# Patient Record
Sex: Female | Born: 1980
Health system: Southern US, Community
[De-identification: ages and names within clinical notes are randomized; demographics above are authoritative.]

## PROBLEM LIST (undated history)

## (undated) DIAGNOSIS — O24419 Gestational diabetes mellitus in pregnancy, unspecified control: Secondary | ICD-10-CM

## (undated) DIAGNOSIS — N8501 Benign endometrial hyperplasia: Secondary | ICD-10-CM

## (undated) HISTORY — DX: Gestational diabetes mellitus in pregnancy, unspecified control: O24.419

## (undated) HISTORY — DX: Benign endometrial hyperplasia: N85.01

## (undated) HISTORY — PX: DILATION AND CURETTAGE OF UTERUS: SHX78

---

## 2002-02-02 ENCOUNTER — Other Ambulatory Visit: Admission: RE | Admit: 2002-02-02 | Discharge: 2002-02-02 | Payer: Self-pay | Admitting: Gynecology

## 2006-09-08 ENCOUNTER — Other Ambulatory Visit: Admission: RE | Admit: 2006-09-08 | Discharge: 2006-09-08 | Payer: Self-pay | Admitting: Gynecology

## 2007-12-31 ENCOUNTER — Emergency Department (HOSPITAL_COMMUNITY): Admission: EM | Admit: 2007-12-31 | Discharge: 2008-01-01 | Payer: Self-pay | Admitting: Emergency Medicine

## 2009-01-03 ENCOUNTER — Encounter: Payer: Self-pay | Admitting: Gynecology

## 2009-01-03 ENCOUNTER — Other Ambulatory Visit: Admission: RE | Admit: 2009-01-03 | Discharge: 2009-01-03 | Payer: Self-pay | Admitting: Gynecology

## 2009-01-03 ENCOUNTER — Ambulatory Visit: Payer: Self-pay | Admitting: Gynecology

## 2009-07-03 ENCOUNTER — Ambulatory Visit: Payer: Self-pay | Admitting: Gynecology

## 2009-07-03 ENCOUNTER — Other Ambulatory Visit: Admission: RE | Admit: 2009-07-03 | Discharge: 2009-07-03 | Payer: Self-pay | Admitting: Gynecology

## 2010-03-27 ENCOUNTER — Other Ambulatory Visit: Admission: RE | Admit: 2010-03-27 | Discharge: 2010-03-27 | Payer: Self-pay | Admitting: Gynecology

## 2010-03-27 ENCOUNTER — Ambulatory Visit: Payer: Self-pay | Admitting: Gynecology

## 2010-05-16 ENCOUNTER — Ambulatory Visit: Payer: Self-pay | Admitting: Gynecology

## 2010-05-28 ENCOUNTER — Ambulatory Visit: Payer: Self-pay | Admitting: Gynecology

## 2010-06-11 ENCOUNTER — Ambulatory Visit: Payer: Self-pay | Admitting: Women's Health

## 2010-06-13 ENCOUNTER — Ambulatory Visit: Payer: Self-pay | Admitting: Gynecology

## 2010-06-19 ENCOUNTER — Ambulatory Visit
Admission: RE | Admit: 2010-06-19 | Discharge: 2010-06-19 | Payer: Self-pay | Source: Home / Self Care | Attending: Gynecology | Admitting: Gynecology

## 2010-06-19 ENCOUNTER — Ambulatory Visit: Payer: Self-pay | Admitting: Gynecology

## 2010-07-11 ENCOUNTER — Ambulatory Visit
Admission: RE | Admit: 2010-07-11 | Discharge: 2010-07-11 | Payer: Self-pay | Source: Home / Self Care | Attending: Gynecology | Admitting: Gynecology

## 2012-08-24 ENCOUNTER — Encounter: Payer: Self-pay | Admitting: Gynecology

## 2012-08-31 ENCOUNTER — Other Ambulatory Visit (HOSPITAL_COMMUNITY)
Admission: RE | Admit: 2012-08-31 | Discharge: 2012-08-31 | Disposition: A | Discharge status: Home / Self Care | Payer: 59 | Source: Ambulatory Visit | Attending: Gynecology | Admitting: Gynecology

## 2012-08-31 ENCOUNTER — Encounter: Payer: Self-pay | Admitting: Gynecology

## 2012-08-31 ENCOUNTER — Ambulatory Visit (INDEPENDENT_AMBULATORY_CARE_PROVIDER_SITE_OTHER): Payer: 59 | Admitting: Gynecology

## 2012-08-31 VITALS — BP 124/78 | Ht 63.5 in | Wt 131.0 lb

## 2012-08-31 DIAGNOSIS — Z1151 Encounter for screening for human papillomavirus (HPV): Secondary | ICD-10-CM | POA: Insufficient documentation

## 2012-08-31 DIAGNOSIS — Z01419 Encounter for gynecological examination (general) (routine) without abnormal findings: Secondary | ICD-10-CM | POA: Insufficient documentation

## 2012-08-31 DIAGNOSIS — Z23 Encounter for immunization: Secondary | ICD-10-CM

## 2012-08-31 LAB — CBC WITH DIFFERENTIAL/PLATELET
Basophils Relative: 0 % (ref 0–1)
Eosinophils Absolute: 0.2 10*3/uL (ref 0.0–0.7)
Eosinophils Relative: 2 % (ref 0–5)
Hemoglobin: 15.6 g/dL — ABNORMAL HIGH (ref 12.0–15.0)
Lymphs Abs: 2.7 10*3/uL (ref 0.7–4.0)
MCH: 30.5 pg (ref 26.0–34.0)
MCHC: 34.9 g/dL (ref 30.0–36.0)
MCV: 87.3 fL (ref 78.0–100.0)
Monocytes Relative: 6 % (ref 3–12)
Neutrophils Relative %: 67 % (ref 43–77)
Platelets: 380 10*3/uL (ref 150–400)
RBC: 5.12 MIL/uL — ABNORMAL HIGH (ref 3.87–5.11)

## 2012-08-31 NOTE — Progress Notes (Signed)
Jennifer Duarte [birth year] [id number]   History:    32 y.o.  for annual gyn exam with no complaints today. Patient wanted to discuss contraceptive options. Patient is not sexually active currently. She is having normal cycles lasting 5-7 days. Several years ago she had good experience with a Mirena IUD. She had a first trimester miscarriage resulting in a D&E in 2012. Patient freely does her self breast examination. Patient complaining of some weight gain.patient with no prior history of abnormal Pap smears  Past medical history,surgical history, family history and social history were all reviewed and documented in the EPIC chart.  Gynecologic History Patient's last menstrual period was 08/23/2012. Contraception: none Last Pap: 2011. Results were: normal Last mammogram: not indicated. Results were: not indicated  Obstetric History OB History   Grav Para Term Preterm Abortions TAB SAB Ect Mult Living   3 2   1  1   2      # Outc Date GA Lbr Len/2nd Wgt Sex Del Anes PTL Lv   1 PAR            2 PAR            3 SAB                ROS: A ROS was performed and pertinent positives and negatives are included in the history.  GENERAL: No fevers or chills. HEENT: No change in vision, no earache, sore throat or sinus congestion. NECK: No pain or stiffness. CARDIOVASCULAR: No chest pain or pressure. No palpitations. PULMONARY: No shortness of breath, cough or wheeze. GASTROINTESTINAL: No abdominal pain, nausea, vomiting or diarrhea, melena or bright red blood per rectum. GENITOURINARY: No urinary frequency, urgency, hesitancy or dysuria. MUSCULOSKELETAL: No joint or muscle pain, no back pain, no recent trauma. DERMATOLOGIC: No rash, no itching, no lesions. ENDOCRINE: No polyuria, polydipsia, no heat or cold intolerance. No recent change in weight. HEMATOLOGICAL: No anemia or easy bruising or bleeding. NEUROLOGIC: No headache, seizures, numbness, tingling or weakness. PSYCHIATRIC: No depression, no loss  of interest in normal activity or change in sleep pattern.     Exam: chaperone present  BP 124/78  Ht 5' 3.5" (1.613 m)  Wt 131 lb (59.421 kg)  BMI 22.84 kg/m2  LMP 08/23/2012  Body mass index is 22.84 kg/(m^2).  General appearance : Well developed well nourished female. No acute distress HEENT: Neck supple, trachea midline, no carotid bruits, no thyroidmegaly Lungs: Clear to auscultation, no rhonchi or wheezes, or rib retractions  Heart: Regular rate and rhythm, no murmurs or gallops Breast:Examined in sitting and supine position were symmetrical in appearance, no palpable masses or tenderness,  no skin retraction, no nipple inversion, no nipple discharge, no skin discoloration, no axillary or supraclavicular lymphadenopathy Abdomen: no palpable masses or tenderness, no rebound or guarding Extremities: no edema or skin discoloration or tenderness  Pelvic:  Bartholin, Urethra, Skene Glands: Within normal limits             Vagina: No gross lesions or discharge  Cervix: No gross lesions or discharge  Uterus  anteverted, normal size, shape and consistency, non-tender and mobile  Adnexa  Without masses or tenderness  Anus and perineum  normal   Rectovaginal  normal sphincter tone without palpated masses or tenderness             Hemoccult not indicated     Assessment/Plan:  32 y.o. female for annual exam who had a Pap smear done today. We did discuss  the new Pap smear screening guidelines. Patient will receive the Tdap vaccine today. The following labs were ordered CBC, cholesterol, hemoglobin A1c, as well as TSH and urinalysis. Literature information on the Mirena IUD was provided. She will make arrangements with the start of her next cycle. She was reminded to do her monthly self breast examination. All the above information was discussed with the patient in Spanish.    [name] MD, 12:19 PM 08/31/2012

## 2012-08-31 NOTE — Addendum Note (Signed)
Addended by: Bertram Savin A on: 08/31/2012 12:50 PM   Modules accepted: Orders

## 2012-08-31 NOTE — Patient Instructions (Addendum)
Informacin sobre el dispositivo intrauterino  (Intrauterine Device Information) El dispositivo intrauterino (DIU) se inserta en el tero e impide el embarazo. Hay dos tipos de DIU:   DIU de cobre. Este tipo de DIU est recubierto con un alambre de cobre y se inserta dentro del tero. El cobre hace que el tero y las trompas de Falopio produzcan un liquido que [employer] espermatozoides. El DIU de cobre puede Geneticist, molecular durante 10 aos.  DIU hormonal. Este tipo de DIU contiene la hormona progestina (progesterona sinttica). La hormona espesa el moco cervical y evita que los espermatozoides ingresen al tero y tambin afina la membrana que cubre el tero para evitar la implantacin del vulo fertilizado. La hormona debilita o destruye los espermatozoides que ingresan al tero. El DIU hormonal puede Geneticist, molecular durante 5 aos. El mdico se asegurar de que usted es una buena candidata para usar el DIU cono anticonceptivo. Hable con su mdico acerca de los posibles efectos secundarios.  VENTAJAS  Es muy eficaz, reversible, de accin prolongada y de bajo mantenimiento.  No hay efectos secundarios relacionados con el estrgeno.  El DIU puede ser utilizado durante la Market researcher.  No est asociado con el aumento de [place].  Funciona inmediatamente despus de la insercin.  El DIU de cobre no interfiere con las hormonas femeninas.  El DIU con progesterona puede hacer que los perodos menstruales no sean tan abundantes.  El DIU de progesterona puede usarse durante 5 aos.  El DIU de cobre puede usarse durante 10 aos. DESVENTAJAS  El DIU de progesterona puede estar asociado con patrones de sangrado irregular.  El DIU de cobre puede hacer que el flujo menstrual ms abundante y doloroso.  Puede experimentar clicos y sangrado vaginal despus de la insercin. Document Released: 11/28/2009 Document Revised: 09/02/2011 [facility] Patient Information 2013 [place],  [place]. Vacuna difteria/ttanos (Td) o [place] difteria, ttanos, tos convulsa (Tdap), Lo que debe saber (Tetanus, Diphtheria [Td] or Tetanus, Diphtheria, Pertussis [Tdap] Vaccine, What You Need to Know) PORQU VACUNARSE? El ttanos , la difteria y la tos ferina pueden ser enfermedades graves.  El TTANOS  (trismo) provoca la contraccin dolorosa y rigidez de los msculos, por lo general, en todo el cuerpo.   Puede causar la contraccin de los msculos de la cabeza y el cuello de modo que el enfermo no puede abrir la boca ni tragar., y en algunos casos, tampoco puede respirar.. El ttanos causa la muerte de 1 de cada 5 personas que se infectan. LA DIFTERIA produce la formacin de una membrana gruesa que cubre el fondo de la garganta.  Puede causar problemas respiratorios, parlisis, insuficiencia cardaca, e incluso la muerte. El PERTUSIS (tos [place]) causa ataques de tos intensa que pueden dificultar la respiracin, provocar vmitos e interrumpir el sueo.   Puede causar prdida de peso, incontinencia, fractura de [place], y desmayos por la intensa tos. Hasta de 2 de cada 100 adolescentes y 5 de cada 100 adultos que enferman de tos [place] deben ser hospitalizados o tienen complicaciones como la neumona y la [place]. Estas 3 enfermedades son provocadas por bacterias. La difteria y la tos [place] Spar se [place] de persona a Social worker. El ttanos ingresa al organismo a travs de cortes, rasguos o heridas. En los Estados [place] ocurran alrededor de 200 000 casos por ao de difteria y tos [place], antes de que existieran las [place], y tambin ocurran cientos de casos de ttanos. Desde la aparicin de las vacunas, el ttanos y la difteria han disminuido  en alrededor del 99% y los casos de tos ferina disminuyeron aproximadamente el 92%.  Los nios menores de 6 aos deben recibir la vacuna DTaP para estar protegidos contra estas tres enfermedades. Pero los [employer], los adolescentes y los adultos tambin  necesitan proteccin. VACUNAS PARA ADOLESCENTES Y ADULTOS Vacunas Tdap y Td  Hay dos vacunas disponibles para proteger de estas enfermedades a nios a Glass blower/designer de los 7aos:   La vacuna Td fue utilizada durante muchos aos. Protege contra el ttanos y la difteria.  La vacuna Tdap fue autorizada en 2005. Es la primera vacuna para adolescentes y adultos que protege contra la tos ferina y el ttanos y la difteria. Una dosis de refuerzo de la Td se recomienda cada 10 aos. La Tdap se aplica slo una vez.  QU VACUNA DEBO APLICARME Y CUANDO? Las edades de 7 a 18 aos  Dynegy 11 y los 12 aos se recomienda una dosis de Tdap. Esta dosis puede aplicarse desde los 7 aos en los nios que no han recibido una o ms dosis de DTaP anteriormente.  Los nios y adolescentes que no recibieron todas las dosis programadas de DTaP o DTP a los 7 aos deben completar la serie usando una combinacin de Td y Tdap. Adultos de 19 aos o ms  [employer] adultos deben recibir una dosis de refuerzo de Td cada 10 aos. Los adultos de menos de 65 aos que nunca hayan recibido la Tdap deben reemplazarla por la siguiente dosis de refuerzo. Los adultos a partir de los 65 aos puedenrecibir una dosis de Tdap.  Los adultos (incluyendo las mujeres que podran quedar embarazadas y los adultos mayores de 65 aos) que tienen contacto cercano con un beb menor de 12 meses deben aplicarse una dosis de Tdap para proteger al beb de la tos [place].  Los trabajadores de la salud que tengan contacto directo con pacientes en hospitales o clnicas deben recibir una dosis de Tdap. Proteccin despus de [place] herida  Es posible que una persona que tenga un corte o quemadura grave necesite una dosis de Td o Tdap para prevenir la infeccin por ttanos. Puede usarse la Tdap en personas que nunca recibieron una dosis. Pero debe usarse la Td, si la Tdap no se encuentra disponible, o para:  Cualquier persona que haya recibido una dosis de  Tdap.  Los nios [employer] 7 y los 9 aos que han [employer] series de DTap anteriormente.  Adultos de 65 aos o ms. Mujeres embarazadas.   Las mujeres embarazadas que nunca recibieron una dosis de Ddap deben recibirla despus de la 20a semana de gestacin y preferiblemente durante Contractor. trimestre. Si no se aplican la Tdap durante el embarazo, deben recibirla lo antes posible despus del parto. Las mujeres embarazadas que han recibido la Tdap y tienen que aplicarse la vacuna contra el ttanos o la difteria durante el [place], deben recibir la Td. Las vacunas Tdap y Td pueden ser administradas al mismo tiempo que otras vacunas. ALGUNAS PERSONAS NO DEBEN RECIBIR LA VACUNA O DEBEN [employer]  Las personas que hayan tenido una reaccin alrgica que haya puesto en peligro su vida despus de una dosis de vacuna contra el ttanos, la difteria o la tos ferina no deben recibir Td ni Tdap..  Las personas que tengan alergias graves a algn componente de una vacuna no deben recibir esa vacuna. Informe a su mdico si la persona que recibe la vacuna sufre alergias graves.  Cualquier persona que [employer]  en coma o que haya tenido convulsiones dentro de los 7 [address]  [address] posteriores despus de una dosis de DTP o DTaP no debe recibir la Tdap, salvo que se encuentre una causa que no fuera la vacuna. Estas personas pueden recibir Td.  Consulte a su mdico si la persona que recibe [place] de las vacunas:  Tiene epilepsia o algn otro problema del sistema nervioso.  Tuvo inflamacin o dolor intenso despus de una dosis de DTP, DTaP, DT, Td, o Tdap.  Ha tenido el sndrome de Scientific laboratory technician (GBS por sus siglas en ingls). Las personas que sufran una enfermedad moderada o grave el da en que se programa la vacuna, deben esperar a recuperarse para recibir las vacunas Tdap o Td. Por lo general, una persona con una enfermedad leve o fiebre baja puede recibir la vacuna. CULES SON LOS RIESGOS DE LAS VACUNAS TDAP Y  TD? Con Cathleen Corti, al igual que con cualquier [employer] Data, siempre existe un pequeo riesgo de una reaccin alrgica que ponga en peligro la vida o cause otro problema grave. Todo procedimiento mdico, inclusive la vacunacin pueden causar breves episodios de lipotimia o sntomas relacionados (como movimientos espasmdicos). Para evitar los [employer] y las lesiones causadas por las cadas, permanezca sentado o recustese durante los 15 minutos posteriores a la vacunacin. Informe a su mdico si el paciente se siente dbil o mareado, tiene cambios en la visin o siente zumbidos en los odos.  Es mucho ms probable que tener ttanos, difteria, o tos ferina cause problemas ms graves que los provocados por recibir cualquiera de las vacunas Td o Tdap. A continuacin se enumeran los problemas informados despus de las vacunas Td y Tdap. Problemas Leves (perceptibles, pero que no interfirieron con las actividades): Tdap  Dolor (alrededor de 3 de cada 4 adolescentes y 2 de cada 3 adultos).  Enrojecimiento o inflamacin en el sitio de la inyeccin (alrededor de 1 de cada 5).  Fiebre leve de al menos 100.4 F (38 C) (hasta alrededor de 1 cada 25 adolescentes y 1 de cada 100 adultos).  Dolor de cabeza (alrededor de 4 de cada 10 adolescentes y 3 de cada 10 adultos).  Cansancio (alrededor de 1 de cada 3 adolescentes y 1 de cada 4 adultos).  Nuseas, vmitos, diarrea, o dolor de estmago (hasta 1 de cada 4 adolescentes y 1 de cada 10 adultos).  Escalofros, Alleene corporales, dolor articular, erupciones, o inflamacin de las glndulas (poco frecuente). Td  Dolor (hasta alrededor de 8 de cada 10).  Enrojecimiento o inflamacin de la inyeccin (alrededor de 1 de cada 3).  Fiebre leve (hasta alrededor de 1 de cada 5).  Dolor de cabeza o cansancio (poco frecuente). Problemas Moderados (interfieren con las [place], [place] no requieren atencin mdica): Tdap  Dolor en el sitio de la inyeccin  (alrededor de 1 de cada 20 adolescentes y 1 de cada 100 adultos).  Enrojecimiento o inflamacin de la inyeccin (alrededor de 1 de cada 16 adolescentes y 1 de cada 25 adultos).  Fiebre de ms de 102 F (38.9 C) (alrededor de 1 de cada 100 adolescentes y 1 de cada 250 adultos).  Dolor de cabeza (1 de cada 300).  Nuseas, vmitos, diarrea, o dolor de estmago (hasta 3 de cada 100 adolescentes y 1 de cada 100 adultos). Td  Fiebre de ms de 102 F (38.9 C) (poco comn). Tdap o Td  Inflamacin de gran extensin en el brazo en el que se aplic la vacuna (hasta 3 de cada 100). Problemas [place] (  no puede realizar [employer]; requiere atencin mdica) Tdap o Td  Inflamacin, dolor intenso, sangrado y enrojecimiento en el brazo, en el sitio de la inyeccin (poco frecuente). Puede producirse una reaccin alrgica grave despus de cualquier vacuna. Se estima que estas reacciones ocurren en menos de una de cada un milln de dosis. QU PASA SI HAY UNA REACCIN GRAVE? Qu signos debo buscar? Cualquier estado poco habitual, como una reaccin alrgica grave o fiebre alta. Si le produce Runner, broadcasting/film/video grave, se manifestar dentro de algunos minutos a una hora despus de recibir la vacuna. Entre los signos de Automotive engineer grave se encuentran la dificultad para respirar, debilidad, ronquera o sibilancias, latidos cardacos acelerados, urticaria, mareos, palidez, o inflamacin de la garganta. Qu debo hacer?  Comunquese con su mdico o lleve inmediatamente a la persona al mdico.  Dgale a su mdico qu ocurri, la fecha y hora en que sucedi y cundo le aplicaron la vacuna.  Pida a su mdico que informe sobre la reaccin llenando un formulario del Sistema de Informacin de Reacciones Adversos a las Administrator, arts (VAERS, por sus siglas en ingls). O, puede presentar este informe a travs del sitio web de VAERS [email or web address].LAgents.no o puede llamar al (705) 585-0035. VAERS no brinda  asistencia mdica. PROGRAMA NACIONAL DE COMPENSACIN DE DAOS POR VACUNAS El [place] de Compensacin de Daos por Vacunas (VICP) fue creado en 1986.  Aquellas personas que consideren que han sufrido un dao como consecuencia de una vacuna y quieren saber ms acerca del programa y como presentar Roslynn Amble, [place] llamar al [phone] o visitar su sitio web en SpiritualWord.at  CMO Roxan Diesel MS INFORMACIN?  El profesional podr darle el prospecto de la vacuna o sugerirle otras fuentes de informacin.  Comunquese con el servicio de salud de su localidad o [address].  Comunquese con los Centros para el control y la prevencin de Child psychotherapist for Disease Control and Prevention , CDC).  Llame al [phone] (1-800-CDC-INFO).  Visite los sitios web de Energy Transfer Partners en PicCapture.uy CDC Td and Tdap Interim VIS-Spanish (07/17/10) Document Released: 09/26/2008 Document Revised: 09/02/2011 [facility] Patient Information 2013 [place], [place].

## 2012-08-31 NOTE — Addendum Note (Signed)
Addended by: Bertram Savin A on: 08/31/2012 12:42 PM   Modules accepted: Orders

## 2012-09-01 LAB — URINALYSIS W MICROSCOPIC + REFLEX CULTURE
Bilirubin Urine: NEGATIVE
Casts: NONE SEEN
Crystals: NONE SEEN
Hgb urine dipstick: NEGATIVE
Ketones, ur: NEGATIVE mg/dL
Specific Gravity, Urine: 1.006 (ref 1.005–1.030)
pH: 7 (ref 5.0–8.0)

## 2012-09-01 LAB — HEMOGLOBIN A1C: Hgb A1c MFr Bld: 5.5 % (ref <5.7)

## 2012-09-01 LAB — TSH: TSH: 0.808 u[IU]/mL (ref 0.350–4.500)

## 2012-09-04 ENCOUNTER — Other Ambulatory Visit: Payer: Self-pay | Admitting: Gynecology

## 2012-09-04 ENCOUNTER — Telehealth: Payer: Self-pay | Admitting: Gynecology

## 2012-09-04 LAB — URINE CULTURE: Colony Count: >=100000

## 2012-09-04 NOTE — Telephone Encounter (Signed)
I LM for pt ref her MIrena IUD benefits. It is covered at 100% with no copay. She is to call if she chooses to proceed/[facility]

## 2012-09-07 ENCOUNTER — Other Ambulatory Visit: Payer: Self-pay | Admitting: Gynecology

## 2012-09-07 ENCOUNTER — Other Ambulatory Visit: Payer: 59

## 2012-09-07 MED ORDER — LEVONORGESTREL 20 MCG/24HR IU IUD: INTRAUTERINE_SYSTEM | Freq: Once | INTRAUTERINE | Status: DC

## 2012-09-08 LAB — URINALYSIS W MICROSCOPIC + REFLEX CULTURE
Bacteria, UA: NONE SEEN
Casts: NONE SEEN
Crystals: NONE SEEN
Hgb urine dipstick: NEGATIVE
Specific Gravity, Urine: >1.03 — ABNORMAL HIGH (ref 1.005–1.030)
pH: 5.5 (ref 5.0–8.0)

## 2012-09-14 ENCOUNTER — Ambulatory Visit (INDEPENDENT_AMBULATORY_CARE_PROVIDER_SITE_OTHER): Payer: 59 | Admitting: Gynecology

## 2012-09-14 ENCOUNTER — Encounter: Payer: Self-pay | Admitting: Gynecology

## 2012-09-14 VITALS — BP 124/80

## 2012-09-14 DIAGNOSIS — R252 Cramp and spasm: Secondary | ICD-10-CM | POA: Insufficient documentation

## 2012-09-14 DIAGNOSIS — Z30430 Encounter for insertion of intrauterine contraceptive device: Secondary | ICD-10-CM

## 2012-09-14 LAB — POTASSIUM: Potassium: 4.4 mEq/L (ref 3.5–5.3)

## 2012-09-14 NOTE — Patient Instructions (Signed)
Informacin sobre el dispositivo intrauterino  (Intrauterine Device Information) El dispositivo intrauterino (DIU) se inserta en el tero e impide el embarazo. Hay dos tipos de DIU:   DIU de cobre. Este tipo de DIU est recubierto con un alambre de cobre y se inserta dentro del tero. El cobre hace que el tero y las trompas de Falopio produzcan un liquido que destruye los espermatozoides. El DIU de cobre puede permanecer en el lugar durante 10 aos.  DIU hormonal. Este tipo de DIU contiene la hormona progestina (progesterona sinttica). La hormona espesa el moco cervical y evita que los espermatozoides ingresen al tero y tambin afina la membrana que cubre el tero para evitar la implantacin del vulo fertilizado. La hormona debilita o destruye los espermatozoides que ingresan al tero. El DIU hormonal puede permanecer en el lugar durante 5 aos. El mdico se asegurar de que usted es una buena candidata para usar el DIU cono anticonceptivo. Hable con su mdico acerca de los posibles efectos secundarios.  VENTAJAS  Es muy eficaz, reversible, de accin prolongada y de bajo mantenimiento.  No hay efectos secundarios relacionados con el estrgeno.  El DIU puede ser utilizado durante la lactancia.  No est asociado con el aumento de peso.  Funciona inmediatamente despus de la insercin.  El DIU de cobre no interfiere con las hormonas femeninas.  El DIU con progesterona puede hacer que los perodos menstruales no sean tan abundantes.  El DIU de progesterona puede usarse durante 5 aos.  El DIU de cobre puede usarse durante 10 aos. DESVENTAJAS  El DIU de progesterona puede estar asociado con patrones de sangrado irregular.  El DIU de cobre puede hacer que el flujo menstrual ms abundante y doloroso.  Puede experimentar clicos y sangrado vaginal despus de la insercin. Document Released: 11/28/2009 Document Revised: 09/02/2011 [employer] Patient Information 2013 [employer].  

## 2012-09-14 NOTE — Progress Notes (Signed)
Patient was seen in the office for annual exam on 08/31/2012 and was requesting have a Mirena IUD placed since she had had one several years ago and had good experience. Literature information was previously provided.                                IUD procedure note       Patient presented to the office today for placement of Mirena IUD. The patient had previously been provided with literature information on this method of contraception. The risks benefits and pros and cons were discussed and all her questions were answered. She is fully aware that this form of contraception is 99% effective and is good for 5 years.  Pelvic exam: Bartholin urethra Skene glands: Within normal limits Vagina: No lesions or discharge, menstrual blood present Cervix: No lesions or discharge Uterus: anteverted position Adnexa: No masses or tenderness Rectal exam: Not done  The cervix was cleansed with Betadine solution. A single-tooth tenaculum was placed on the anterior cervical lip. The uterus sounded to 7 centimeter. The IUD was shown to the patient and inserted in a sterile fashion. The IUD string was trimmed. The single-tooth tenaculum was removed. Patient was instructed to return back to the office in one month for follow up.  Patient stated that time she's had lower extremity cramps especially she's on her feet for long periods of time we will check a potassium level today as well.

## 2012-09-16 ENCOUNTER — Encounter: Payer: Self-pay | Admitting: Gynecology

## 2012-10-13 ENCOUNTER — Ambulatory Visit (INDEPENDENT_AMBULATORY_CARE_PROVIDER_SITE_OTHER): Payer: 59

## 2012-10-13 ENCOUNTER — Encounter: Payer: Self-pay | Admitting: Gynecology

## 2012-10-13 ENCOUNTER — Ambulatory Visit (INDEPENDENT_AMBULATORY_CARE_PROVIDER_SITE_OTHER): Payer: 59 | Admitting: Gynecology

## 2012-10-13 VITALS — BP 120/78

## 2012-10-13 DIAGNOSIS — N921 Excessive and frequent menstruation with irregular cycle: Secondary | ICD-10-CM

## 2012-10-13 DIAGNOSIS — N8300 Follicular cyst of ovary, unspecified side: Secondary | ICD-10-CM

## 2012-10-13 DIAGNOSIS — Z975 Presence of (intrauterine) contraceptive device: Secondary | ICD-10-CM

## 2012-10-13 DIAGNOSIS — Z30431 Encounter for routine checking of intrauterine contraceptive device: Secondary | ICD-10-CM

## 2012-10-13 DIAGNOSIS — N719 Inflammatory disease of uterus, unspecified: Secondary | ICD-10-CM

## 2012-10-13 MED ORDER — ESTRADIOL 1 MG PO TABS: ORAL_TABLET | ORAL | Status: DC

## 2012-10-13 MED ORDER — DOXYCYCLINE HYCLATE 100 MG PO CAPS: 100.0000 mg | ORAL_CAPSULE | Freq: Two times a day (BID) | ORAL | Status: DC

## 2012-10-13 NOTE — Progress Notes (Signed)
Patient presented to the office today for one month followup after having placed a Mirena IUD. Patient had a Mirena IUD placed in 08/31/2012. Patient states she's been bleeding continues since the IUD was placed. She had minimal cramping. She denies any fever nausea or vomiting.  Exam: Abdomen soft nontender no rebound or guarding Pelvic: Bartholin urethra Skene was within normal limits Vagina: Some blood in the vaginal vault was noted Cervix: IUD string tip was noted Uterus: Anteverted normal size shape and consistency Adnexa: No palpable masses or tenderness Rectal exam: Not done  Ultrasound today: Uterus measured 8.5 x 4.7 x 3.4 cm with endometrial stripe of 3.9 mm. IUD was seen in the endometrial cavity and the right position. Right and left ovary were normal. No fluid in the cul-de-sac. No masses were seen on either adnexa  Assessment/plan: Patient one month status post placement of Mirena IUD. In the event that she may have a mild endometritis she will be placed on Vibramycin 100 mg one by mouth twice a day for 7 days. To build up her endometrium and help with her bleeding she will be placed on Estrace 1 mg daily for 30 days.  Of note on March 10 of this year her hemoglobin was 15.6 platelet count 380,000.

## 2012-10-14 ENCOUNTER — Other Ambulatory Visit: Payer: 59

## 2013-04-26 ENCOUNTER — Other Ambulatory Visit: Payer: Self-pay | Admitting: Gynecology

## 2013-04-26 ENCOUNTER — Ambulatory Visit (INDEPENDENT_AMBULATORY_CARE_PROVIDER_SITE_OTHER): Payer: 59 | Admitting: Gynecology

## 2013-04-26 ENCOUNTER — Encounter: Payer: Self-pay | Admitting: Gynecology

## 2013-04-26 ENCOUNTER — Ambulatory Visit (INDEPENDENT_AMBULATORY_CARE_PROVIDER_SITE_OTHER): Payer: 59

## 2013-04-26 VITALS — BP 122/70

## 2013-04-26 DIAGNOSIS — IMO0002 Reserved for concepts with insufficient information to code with codable children: Secondary | ICD-10-CM

## 2013-04-26 DIAGNOSIS — N949 Unspecified condition associated with female genital organs and menstrual cycle: Secondary | ICD-10-CM

## 2013-04-26 DIAGNOSIS — N8300 Follicular cyst of ovary, unspecified side: Secondary | ICD-10-CM

## 2013-04-26 DIAGNOSIS — N898 Other specified noninflammatory disorders of vagina: Secondary | ICD-10-CM

## 2013-04-26 DIAGNOSIS — R102 Pelvic and perineal pain: Secondary | ICD-10-CM

## 2013-04-26 DIAGNOSIS — Z30432 Encounter for removal of intrauterine contraceptive device: Secondary | ICD-10-CM

## 2013-04-26 DIAGNOSIS — R634 Abnormal weight loss: Secondary | ICD-10-CM

## 2013-04-26 LAB — CBC WITH DIFFERENTIAL/PLATELET
HCT: 43.6 % (ref 36.0–46.0)
Hemoglobin: 15.4 g/dL — ABNORMAL HIGH (ref 12.0–15.0)
Lymphocytes Relative: 25 % (ref 12–46)
Monocytes Absolute: 0.8 10*3/uL (ref 0.1–1.0)
Monocytes Relative: 7 % (ref 3–12)
Neutro Abs: 7.2 10*3/uL (ref 1.7–7.7)
RBC: 4.86 MIL/uL (ref 3.87–5.11)
WBC: 10.9 10*3/uL — ABNORMAL HIGH (ref 4.0–10.5)

## 2013-04-26 LAB — COMPREHENSIVE METABOLIC PANEL
ALT: 11 U/L (ref 0–35)
Albumin: 4.3 g/dL (ref 3.5–5.2)
Alkaline Phosphatase: 69 U/L (ref 39–117)
CO2: 26 mEq/L (ref 19–32)
Glucose, Bld: 91 mg/dL (ref 70–99)
Potassium: 4.1 mEq/L (ref 3.5–5.3)
Sodium: 137 mEq/L (ref 135–145)
Total Protein: 6.9 g/dL (ref 6.0–8.3)

## 2013-04-26 LAB — WET PREP FOR TRICH, YEAST, CLUE: Trich, Wet Prep: NONE SEEN

## 2013-04-26 MED ORDER — METRONIDAZOLE 0.75 % VA GEL: VAGINAL | Status: DC

## 2013-04-26 MED ORDER — METRONIDAZOLE 0.75 % VA GEL: 1.0000 | Freq: Two times a day (BID) | VAGINAL | Status: DC

## 2013-04-26 MED ORDER — NORETHIN ACE-ETH ESTRAD-FE 1-20 MG-MCG(24) PO TABS: 1.0000 | ORAL_TABLET | Freq: Every day | ORAL | Status: DC

## 2013-04-26 NOTE — Progress Notes (Signed)
Jennifer Duarte September 07, 1980 [id number]   History:    32 y.o.  for annual gyn exam requesting to have her Mirena IUD that was placed earlier this year. The patient complains of cramping right greater than her left at different times slow sometimes discomfort during intercourse. She also feels moist many times and she's had Mirena IUD. Patient has been exercise more regularly but was concerned that she had lost 10 pounds in 2 months. She states that she works out 3-4 times a week for an average of 2 hours per workout. Patient also has been concern with acne.  Exam: Abdomen soft nontender no rebound or guarding Pelvic: Bartholin urethra Skene was within normal limits Vagina: No lesions or discharge Cervix: IUD string not seen Uterus: Anteverted slightly tender right lower abdomen greater than left but no rebound or guarding Rectal: Not done  Wet prep: Few bacteria Ultrasound: Uterus measured 8.7 x 5.3 x 3.9 cm endometrial stripe 1.6 mm. Right and left ovary were normal. No fluid in the cul-de-sac. No adnexal masses.  As per patient's request a Mirena IUD was removed after the cervix was cleansed with Betadine solution. The [place] clamp was utilized to grasp the IUD string to retrieve. The baby was shown to the patient and discarded.  Assessment/plan: IUD was removed per patient request because of constant vaginal discharge and wet sensation. She will be started on Lomedia 28 day oral contraceptive pill with the start of her next cycle. Meanwhile she will use condoms for contraception. The oral contraceptive pill will help with her acne. We are going to check her CBC, comprehensive metabolic panel and TSH today. I believe that her weight loss since March of this to her extensive exercises she started the past few months. MetroGel vaginal cream to apply each bedtime for 5-7 days when necessary was provided.

## 2013-04-26 NOTE — Patient Instructions (Signed)
Uso de los anticonceptivos orales  (Oral Contraception Use) Los anticonceptivos orales son medicamentos que se utilizan para Location manager. Su funcin es [employer] ovarios liberen vulos. Las hormonas de los anticonceptivos orales hacen que el moco cervical se haga ms espeso, lo que evita que el esperma ingrese al tero. Tambin hacen que la membrana que tapiza el tero se vuelva ms fina, lo que no permite que el huevo fertilizado se adhiera a la pared del tero. Los anticonceptivos orales son muy efectivos cuando se toman exactamente como se prescriben. Sin embargo, los anticonceptivos orales no previenen contra las enfermedades de transmisin sexual (ETS). La prctica del sexo seguro, como el uso de preservativos, junto con los anticonceptivos [place], [place] a prevenir ese tipo de enfermedades. Antes de tomar la pldora, usted debe hacerse un examen fsico y un [place]. El mdico podr indicarle anlisis de [place], si es necesario. El mdico se asegurar de que usted es una buena candidata para usar anticonceptivos orales. Converse con su mdico acerca de los posibles efectos secundarios de los anticonceptivos [place]. Cuando se inicia el uso de anticonceptivos [place], se pueden tomar durante 2 a 3 meses para que el cuerpo se adapte a los cambios en los niveles hormonales en el cuerpo.  CMO TOMAR LOS ANTICONCEPTIVOS ORALES  El mdico le indicar como comenzar a Surveyor, minerals ciclo de anticonceptivos orales. De lo contrario usted puede:   Psychiatric nurse. da del ciclo menstrual. No necesitar proteccin anticonceptiva adicional al Investment banker, operational.  Comenzar Financial risk analyst domingo luego de su perodo menstrual, o Medical laboratory scientific officer en que adquiere el Automatic Data. En estos casos deber EchoStar proteccin anticonceptiva [employer] primeros 7 das del [place]. Luego de comenzar a tomar los anticonceptivos orales:   Si olvid de tomar 1 pldora, tmela tan pronto como lo recuerde. Tome la  siguiente pldora a la hora habitual.  Si olvida tomar 2  ms pldoras, utilice un mtodo anticonceptivo adicional hasta que comience su prximo perodo menstrual.  Si utiliza el envase de 28 pldoras y [place] tomar 1 de las ltimas 7 (pldoras sin hormonas), sto no tiene Quarry manager. Simplemente deseche el resto de las pldoras que no contienen hormonas y comience un nuevo envase. No importa cuando comience a tomar los anticonceptivos, siempre empiece un nuevo envase el mismo da de la [place]. Tenga un envase extra de pldoras anticonceptivas y use un mtodo anticonceptivo adicional para el caso en que se olvide de tomar algunas pldoras o pierda la caja.  INSTRUCCIONES PARA EL CUIDADO DOMICILIARIO  No fume.  Siempre use un condn para protegerse de las enfermedades de transmisin sexual. Los anticonceptivos orales no protegen contra las enfermedades de transmisin sexual.  Research scientist (medical) en un calendario las fechas en las que tiene sus perodos [place].  Lea la informacin y consejos que vienen con las pldoras. Pngase en contacto con el mdico siempre que tenga preguntas. SOLICITE ATENCIN MDICA SI:  Presenta nuseas o vmitos.  Tiene flujo o sangrado vaginal anormal.  Aparece una erupcin cutnea.  No tiene el perodo menstrual.  Pierde el cabello.  Necesita tratamiento por cambios en su estado de nimo o por depresin.  Se siente mareada al tomar la pldora.  Comienza a aparecer acn con el uso de los anticonceptivos orales.  Ardelle Anton. SOLICITE ATENCIN MDICA DE INMEDIATO SI:  Siente dolor en el pecho.  Le falta el aire.  Le duele mucho la cabeza y no puede Human resources officer.  Siente adormecimiento o tiene  dificultad para hablar.  Tiene problemas de visin.  Presenta dolor, inflamacin o hinchazn en las piernas. Document Released: 05/30/2011 Document Revised: 09/02/2011 [facility] Patient Information 2014 [place], [place].

## 2013-04-28 ENCOUNTER — Telehealth: Payer: Self-pay | Admitting: Anesthesiology

## 2013-04-28 NOTE — Telephone Encounter (Signed)
Dr. Cleone Slim had an appt with you on 11/3 and a birth control prescription was supposed to be sent to her pharmacy but they didn't received it.. Please advise.Marland KitchenMarland Kitchen

## 2013-04-29 ENCOUNTER — Telehealth: Payer: Self-pay | Admitting: *Deleted

## 2013-04-29 MED ORDER — NORETHIN ACE-ETH ESTRAD-FE 1-20 MG-MCG(24) PO TABS: 1.0000 | ORAL_TABLET | Freq: Every day | ORAL | Status: DC

## 2013-04-29 NOTE — Telephone Encounter (Signed)
Prescription was called in to pharmacy by [employer]

## 2013-04-29 NOTE — Telephone Encounter (Signed)
Please call in prescription for Lomedia 28 OCP 1 month supplt with 3 refills. Patient to start on second day of her menses.

## 2013-04-29 NOTE — Telephone Encounter (Addendum)
Pt called stating birth control pills not at pharmacy, I left message for pt to call Rx was sent to [facility]. I will resend rx for pt.

## 2013-10-19 ENCOUNTER — Ambulatory Visit (INDEPENDENT_AMBULATORY_CARE_PROVIDER_SITE_OTHER): Payer: 59 | Admitting: Gynecology

## 2013-10-19 ENCOUNTER — Encounter: Payer: Self-pay | Admitting: Gynecology

## 2013-10-19 VITALS — BP 118/76

## 2013-10-19 DIAGNOSIS — R5383 Other fatigue: Secondary | ICD-10-CM

## 2013-10-19 DIAGNOSIS — G4700 Insomnia, unspecified: Secondary | ICD-10-CM

## 2013-10-19 DIAGNOSIS — R5381 Other malaise: Secondary | ICD-10-CM

## 2013-10-19 DIAGNOSIS — R110 Nausea: Secondary | ICD-10-CM

## 2013-10-19 DIAGNOSIS — N926 Irregular menstruation, unspecified: Secondary | ICD-10-CM

## 2013-10-19 DIAGNOSIS — R51 Headache: Secondary | ICD-10-CM

## 2013-10-19 LAB — CBC WITH DIFFERENTIAL/PLATELET
BASOS ABS: 0 10*3/uL (ref 0.0–0.1)
Basophils Relative: 0 % (ref 0–1)
Eosinophils Absolute: 0.2 10*3/uL (ref 0.0–0.7)
Eosinophils Relative: 2 % (ref 0–5)
HCT: 42.5 % (ref 36.0–46.0)
Hemoglobin: 14.6 g/dL (ref 12.0–15.0)
Lymphocytes Relative: 30 % (ref 12–46)
Lymphs Abs: 3.3 10*3/uL (ref 0.7–4.0)
MCH: 30.7 pg (ref 26.0–34.0)
MCHC: 34.4 g/dL (ref 30.0–36.0)
MCV: 89.5 fL (ref 78.0–100.0)
Monocytes Absolute: 0.4 10*3/uL (ref 0.1–1.0)
Monocytes Relative: 4 % (ref 3–12)
NEUTROS ABS: 7 10*3/uL (ref 1.7–7.7)
Neutrophils Relative %: 64 % (ref 43–77)
PLATELETS: 349 10*3/uL (ref 150–400)
RBC: 4.75 MIL/uL (ref 3.87–5.11)
RDW: 12.8 % (ref 11.5–15.5)
WBC: 10.9 10*3/uL — AB (ref 4.0–10.5)

## 2013-10-19 LAB — PREGNANCY, URINE: Preg Test, Ur: NEGATIVE

## 2013-10-19 NOTE — Progress Notes (Signed)
   Patient is a 33 year old gravida 3 para 2 Ab1 (spontaneous AB) who is on a 20 mcg oral contraceptive pill since last year when her Marine IUD was removed. The patient had been having normal menstrual cycles. This pass cycle she forgot to take the oral contraceptive pill on 2 occasion and had had some breakthrough bleeding. She also has had some nausea, dizziness at times but sleepiness along with tiredness and fatigue. Patient has had no appetite changes and no changes in bowel movement or urination. Patient 2 weeks ago had a negative urine home pregnancy test.  Exam: HEENT unremarkable Lungs: Clear to auscultation or agars or wheezes Heart: Regular rate and rhythm no murmurs or gallops  Urine pregnancy test in the office today negative  Assessment/plan: Patient with breakthrough bleeding as a result of compliance twice with oral contraceptive pill does pass cycle. Patient with negative pregnancy test. Patient was instructed to take her birth control pill at the same time every day to prevent this from recurring. Because of the tiredness and fatigue and sleepiness the following tests were ordered. Hemoglobin A1c, comprehensive metabolic panel, TSH, CBC, prolactin and HIV. The prolactin will be checked because of her history of headaches although she denies any visual disturbances or nipple discharge. If all the above tests come back normal and she continues with her symptoms I am going to referred to an internist for further evaluation and testing.

## 2013-10-20 LAB — TSH: TSH: 1.147 u[IU]/mL (ref 0.350–4.500)

## 2013-10-20 LAB — COMPREHENSIVE METABOLIC PANEL
ALBUMIN: 4 g/dL (ref 3.5–5.2)
ALT: 22 U/L (ref 0–35)
AST: 18 U/L (ref 0–37)
Alkaline Phosphatase: 66 U/L (ref 39–117)
BUN: 11 mg/dL (ref 6–23)
CHLORIDE: 104 meq/L (ref 96–112)
CO2: 23 mEq/L (ref 19–32)
CREATININE: 0.63 mg/dL (ref 0.50–1.10)
Calcium: 9.2 mg/dL (ref 8.4–10.5)
Glucose, Bld: 73 mg/dL (ref 70–99)
POTASSIUM: 4.2 meq/L (ref 3.5–5.3)
Sodium: 136 mEq/L (ref 135–145)
Total Bilirubin: 0.3 mg/dL (ref 0.2–1.2)
Total Protein: 6.8 g/dL (ref 6.0–8.3)

## 2013-10-20 LAB — HEMOGLOBIN A1C
Hgb A1c MFr Bld: 5.7 % — ABNORMAL HIGH (ref <5.7)
Mean Plasma Glucose: 117 mg/dL — ABNORMAL HIGH (ref <117)

## 2013-10-20 LAB — PROLACTIN: PROLACTIN: 8.6 ng/mL

## 2013-10-20 LAB — HIV ANTIBODY (ROUTINE TESTING W REFLEX): HIV 1&2 Ab, 4th Generation: NONREACTIVE

## 2013-10-20 LAB — VITAMIN D 25 HYDROXY (VIT D DEFICIENCY, FRACTURES): Vit D, 25-Hydroxy: 42 ng/mL (ref 30–89)

## 2013-11-24 ENCOUNTER — Telehealth: Payer: Self-pay | Admitting: *Deleted

## 2013-11-24 NOTE — Telephone Encounter (Signed)
Pt informed with all lab result for 10/19/13.

## 2014-04-25 ENCOUNTER — Encounter: Payer: Self-pay | Admitting: Gynecology

## 2014-04-26 ENCOUNTER — Encounter: Payer: Self-pay | Admitting: Gynecology

## 2014-04-26 ENCOUNTER — Ambulatory Visit (INDEPENDENT_AMBULATORY_CARE_PROVIDER_SITE_OTHER): Payer: 59 | Admitting: Gynecology

## 2014-04-26 VITALS — BP 118/76

## 2014-04-26 DIAGNOSIS — N912 Amenorrhea, unspecified: Secondary | ICD-10-CM

## 2014-04-26 DIAGNOSIS — Z3490 Encounter for supervision of normal pregnancy, unspecified, unspecified trimester: Secondary | ICD-10-CM | POA: Insufficient documentation

## 2014-04-26 DIAGNOSIS — N898 Other specified noninflammatory disorders of vagina: Secondary | ICD-10-CM

## 2014-04-26 DIAGNOSIS — Z331 Pregnant state, incidental: Secondary | ICD-10-CM

## 2014-04-26 DIAGNOSIS — Z113 Encounter for screening for infections with a predominantly sexual mode of transmission: Secondary | ICD-10-CM

## 2014-04-26 LAB — WET PREP FOR TRICH, YEAST, CLUE
TRICH WET PREP: NONE SEEN
WBC, Wet Prep HPF POC: NONE SEEN
YEAST WET PREP: NONE SEEN

## 2014-04-26 LAB — PREGNANCY, URINE: Preg Test, Ur: POSITIVE

## 2014-04-26 MED ORDER — CLINDAMYCIN HCL 300 MG PO CAPS: ORAL_CAPSULE | ORAL | Status: DC

## 2014-04-26 NOTE — Progress Notes (Signed)
   Patient is a 33 year old who presented to the office today stating that her last menstrual was 19th of September. In November of last year she was started on oral contraceptive pill. She stated she was taking the pill and conceived. She denies having forgot to take the pill. She's had some low abdominal bloating but no pain. No unusual bleeding reported. Some slight breast tenderness but no nausea or vomiting.patient is now a gravida 4 para 2 AB 1.  Based on patient's last menstrual period she would be a proximally 6-1/2 weeks into her pregnancy with an estimated date of confinement 12/19/2014.  Patient was complaining of vaginal discharge with slight odor.  Exam: Abdomen: Soft nontender no rebound or guarding Pelvic: Bartholin urethra Skene was within normal limits Vagina: Creamy-like discharge with Fish like odor was noted. Besides a wet prep a GC and Chlamydia culture was obtained. Cervix: No gross lesions on inspection Uterus: 4-6 weeks size nontender no rebound or guarding Adnexa: No palpable mass or tenderness Rectal exam not done   Urine pregnancy test: Positive  Wet prep: Amine positive, many clue cells, too numerous to count bacteria  Assessment/plan: Early first trimester pregnancy. Patient conceived while on the oral contraceptive pill. A quantitative beta-hCG will be obtained today and to repeat in one week. She will then return to the office in 2 weeks for an ultrasound for confirmation of dates and viability. Instructions on do's and don'ts in pregnancy was provided. She was recommended to begin taking prenatal vitamin. For her bacterial vaginosis she will be placed on clindamycin 300 mg one by mouth twice a day for 7 days. A GC and Chlamydia culture was obtained and is pending at time of this dictation.

## 2014-04-26 NOTE — Addendum Note (Signed)
Addended by: Berna SpareASTILLO, BLANCA A on: 04/26/2014 01:53 PM   Modules accepted: Orders

## 2014-04-26 NOTE — Addendum Note (Signed)
Addended by: Berna SpareASTILLO, [place] A on: 04/26/2014 02:15 PM   Modules accepted: Orders

## 2014-04-26 NOTE — Patient Instructions (Signed)
Vaginosis bacteriana (Bacterial Vaginosis) La vaginosis bacteriana es una infeccin de la vagina. Se produce cuando una cantidad excesiva de ciertos grmenes (bacterias) crece en la vagina. CUIDADOS EN EL HOGAR  Tome los medicamentos tal como se lo indic su mdico.  Finalice la prescripcin completa, aunque comience a sentirse mejor.  No mantenga relaciones sexuales hasta que finalice sus medicamentos y se Therapist, occupationalsienta mejor.  Comunique a sus compaeros sexuales que sufre una infeccin. Deben consultar a su mdico para iniciar un tratamiento.  Practique el sexo seguro. Use preservativos. Tenga solo un compaero sexual. SOLICITE AYUDA SI:  No mejora luego de 3 das de Necedahtratamiento.  Observa una secrecin (prdida) de color gris ms abundante que proviene de la vagina.  Siente ms dolor que antes.  Tiene fiebre. ASEGRESE DE QUE:   Comprende estas instrucciones.  Controlar su afeccin.  Recibir ayuda de inmediato si no mejora o si empeora. Document Released: 09/06/2008 Document Revised: 03/31/2013 [facility] Patient Information 2015 [place], [place]. This information is not intended to replace advice given to you by your health care provider. Make sure you discuss any questions you have with your health care provider. Primer trimestre de Psychiatristembarazo (First Trimester of Pregnancy) El primer trimestre de Psychiatristembarazo se extiende desde la semana1 hasta el final de la semana12 (mes1 al mes3). Una semana despus de que un espermatozoide fecunda un vulo, este se implantar en la pared uterina. Este embrin comenzar a Camera operatordesarrollarse hasta convertirse en un beb. Sus genes y los de su pareja forman el beb. Los genes del varn determinan si ser un nio o una nia. Entre la semana6 y [place]8, se forman los ojos y Delwayel rostro, y los latidos del corazn pueden verse en la ecografa. Al final de las 12semanas, todos los rganos del beb estn formados.  Ahora que est embarazada, querr hacer todo lo que  est a su alcance para tener un beb sano. Dos de las cosas ms importantes son Winferd Humphreytener una buena atencin prenatal y seguir las indicaciones del mdico. La atencin prenatal incluye toda la asistencia mdica que usted recibe antes del nacimiento del beb. Esta ayudar a prevenir, Engineer, manufacturingdetectar y tratar cualquier problema durante el embarazo y [place]el parto. CAMBIOS EN EL ORGANISMO Su organismo atraviesa por muchos cambios durante el [place] Settlementembarazo, y estos varan de Neomia Dearuna mujer a Educational psychologistotra.   Al principio, puede aumentar o bajar algunos kilos.  Puede tener Programme researcher, broadcasting/film/videomalestar estomacal (nuseas) y vomitar. Si no puede controlar los vmitos, llame al mdico.  Puede cansarse con facilidad.  Es posible que tenga [place] de cabeza que pueden aliviarse con los medicamentos que el mdico le permita tomar.  Puede orinar con mayor frecuencia. El dolor al orinar puede significar que usted tiene una infeccin de la vejiga.  Debido al Vanetta Muldersembarazo, puede tener acidez estomacal.  Puede estar estreida, ya que ciertas hormonas enlentecen los movimientos de los msculos que New York Life Insuranceempujan los desechos a travs de los intestinos.  Pueden aparecer hemorroides o abultarse e hincharse las venas (venas varicosas).  Las ConAgra Foodsmamas pueden empezar a Government social research officeragrandarse y Emergency planning/management officerestar sensibles. Los pezones pueden sobresalir ms, y el tejido que los rodea (areola) tornarse ms oscuro.  Las Veterinary surgeonencas pueden sangrar y estar sensibles al cepillado y al hilo dental.  Pueden aparecer zonas oscuras o manchas (cloasma, mscara del Psychiatristembarazo) en el rostro que probablemente se atenuarn despus del nacimiento del beb.  Los perodos menstruales se interrumpirn.  Tal vez no tenga apetito.  Puede sentir un fuerte deseo de consumir ciertos alimentos.  Puede tener Baker Hughes Incorporatedcambios  a Theatre manager a da, por ejemplo, por momentos puede estar emocionada por el Big Lots y por otros preocuparse porque algo pueda salir mal con el embarazo o el beb.  Tendr sueos ms vvidos y extraos.  Tal  vez haya cambios en el cabello que pueden incluir su engrosamiento, crecimiento rpido y cambios en la textura. A algunas mujeres tambin se les cae el cabello durante o despus del [place], o tienen el cabello seco o fino. Lo ms probable es que el cabello se le normalice despus del nacimiento del beb. QU DEBE ESPERAR EN LAS CONSULTAS PRENATALES Durante una visita prenatal de rutina:  La pesarn para asegurarse de que usted y el beb estn creciendo normalmente.  Le controlarn la presin arterial.  Le medirn el abdomen para controlar el desarrollo del beb.  Se escucharn los latidos cardacos a partir de la semana10 o la12 de embarazo, aproximadamente.  Se analizarn los resultados de los estudios solicitados en visitas anteriores. El mdico puede preguntarle:  Cmo se siente.  Si siente los movimientos del beb.  Si ha tenido sntomas anormales, como prdida de lquido, [place], Jaylynn de cabeza intensos o clicos abdominales.  Si tiene Colgate-Palmolive. Otros estudios que pueden realizarse durante el primer trimestre incluyen lo siguiente:  Anlisis de sangre para determinar el tipo de sangre y Engineer, manufacturing la presencia de infecciones previas. Adems, se los usar para controlar si los niveles de hierro son bajos (anemia) y Chief Strategy Officer los anticuerpos Rh. En una etapa ms avanzada del [place], se harn anlisis de sangre para saber si tiene diabetes, junto con otros estudios si surgen problemas.  Anlisis de orina para detectar infecciones, diabetes o protenas en la orina.  Una ecografa para confirmar que el beb crece y se desarrolla correctamente.  Una amniocentesis para diagnosticar posibles problemas genticos.  Estudios del feto para descartar espina bfida y sndrome de [place].  Es posible que necesite otras pruebas adicionales. INSTRUCCIONES PARA EL CUIDADO EN EL HOGAR  Medicamentos:  Siga las indicaciones del mdico en relacin con el uso de medicamentos.  Durante el embarazo, hay medicamentos que pueden tomarse y otros que no.  Tome las vitaminas prenatales como se le indic.  Si est estreida, tome un laxante suave, si el mdico lo [place]. Dieta  Consuma alimentos balanceados. Elija alimentos variados, como carne o protenas de origen vegetal, pescado, leche y productos lcteos descremados, verduras, frutas y panes y Radiation protection practitioner. El mdico la ayudar a Production assistant, radio cantidad de peso que puede [place].  No coma carne cruda ni quesos sin cocinar. Estos elementos contienen bacterias que pueden causar defectos congnitos en el beb.  La ingesta diaria de cuatro o cinco comidas pequeas en lugar de tres comidas abundantes puede ayudar a Yahoo nuseas y los vmitos. Si empieza a tener nuseas, comer algunas [address] puede ser de [place]. Beber lquidos [place] comidas en lugar de tomarlos durante las comidas tambin puede ayudar a Optician, dispensing las nuseas y los vmitos.  Si est estreida, consuma alimentos con alto contenido de [place], como verduras y frutas frescas, y Radiation protection practitioner. Beba suficiente lquido para Photographer orina clara o de color amarillo plido. Actividad y Landscape architect ejercicio solamente como se lo haya indicado el mdico. El ejercicio la ayudar a:  Art gallery manager.  Mantenerse en forma.  Estar preparada para el trabajo de parto y [place].  Los [place], los clicos en la parte baja del abdomen o los calambres en la cintura son un buen  indicio de que debe dejar de hacer ejercicios. Consulte al mdico antes de seguir haciendo ejercicios normales.  Intente no estar de pie FedEx. Mueva las piernas con frecuencia si debe estar de pie en un lugar durante mucho tiempo.  Evite levantar pesos Fortune Brands.  Use zapatos de tacones bajos y [place].  Puede seguir teniendo The [place] Travelers, excepto que el mdico le indique lo contrario. Alivio del dolor o las  molestias  Use un sostn que le brinde buen soporte si siente dolor a la palpacin Mattel.  Dese baos de asiento con agua tibia para Engineer, materials o las molestias causadas por las hemorroides. Use crema antihemorroidal si el mdico se lo permite.  Descanse con las piernas elevadas si tiene calambres o dolor de cintura.  Si tiene venas varicosas en las piernas, use medias de descanso. Eleve los pies durante , 3 o 4veces por da. Limite la cantidad de sal en su dieta. Cuidados prenatales  Programe las visitas prenatales para la semana12 de [place]. Generalmente se programan cada mes al principio y se hacen ms frecuentes en los 2 ltimos meses antes del parto.  Escriba sus preguntas. Llvelas cuando concurra a las visitas prenatales.  Concurra a todas las visitas prenatales como se lo haya indicado el mdico. Seguridad  Colquese el cinturn de seguridad cuando conduzca.  Haga una lista de los nmeros de telfono de Associate Professor, que [employer] nmeros de telfono de familiares, [place], el hospital y los departamentos de polica y bomberos. Consejos generales  Pdale al mdico que la derive a clases de educacin prenatal en su localidad. Debe comenzar a tomar las clases antes de Cytogeneticist en el mes6 de embarazo.  Pida ayuda si tiene necesidades nutricionales o de asesoramiento Academic librarian. El mdico puede aconsejarla o derivarla a especialistas para que la ayuden con diferentes necesidades.  No se d baos de inmersin en agua caliente, baos turcos ni saunas.  No se haga duchas vaginales ni use tampones o toallas higinicas perfumadas.  No mantenga las piernas cruzadas durante [place].  Evite el contacto con las bandejas sanitarias de los gatos y la tierra que estos animales usan. Estos elementos contienen bacterias que pueden causar defectos congnitos al beb y la posible prdida del feto debido a un aborto espontneo o muerte fetal.  No fume, no  consuma hierbas ni medicamentos que no hayan sido recetados por el mdico. Las sustancias qumicas que estos productos contienen afectan la formacin y el desarrollo del beb.  Programe una cita con el dentista. En su casa, lvese los dientes con un cepillo dental blando y psese el hilo dental con suavidad. SOLICITE ATENCIN MDICA SI:   Tiene mareos.  Siente clicos leves, presin en la pelvis o dolor persistente en el abdomen.  Tiene nuseas, vmitos o diarrea persistentes.  Tiene secrecin vaginal con mal olor.  Siente dolor al ConocoPhillips.  Tiene el rostro, las Sellers, las piernas o los tobillos ms hinchados. SOLICITE ATENCIN MDICA DE INMEDIATO SI:   Tiene fiebre.  Tiene una prdida de lquido por la vagina.  Tiene sangrado o pequeas prdidas vaginales.  Siente dolor intenso o clicos en el abdomen.  Sube o baja de peso rpidamente.  Vomita sangre de color rojo brillante o material que parezca granos de caf.  Ha estado expuesta a la rubola y no ha sufrido la enfermedad.  Ha estado expuesta a la quinta enfermedad o a la varicela.  Tiene un dolor de cabeza intenso.  Le falta el aire.  Sufre cualquier tipo de traumatismo, por ejemplo, debido a una cada o un accidente automovilstico. Document Released: 03/20/2005 Document Revised: 10/25/2013 [facility] Patient Information 2015 [place]Care, [place]LL[place]. This information is not intended to replace advice given to you by your health care provider. Make sure you discuss any questions you have with your health care provider.

## 2014-04-27 LAB — HCG, QUANTITATIVE, PREGNANCY: hCG, Beta Chain, Quant, S: 21754.4 m[IU]/mL

## 2014-04-27 LAB — GC/CHLAMYDIA PROBE AMP
CT Probe RNA: NEGATIVE
GC Probe RNA: NEGATIVE

## 2014-05-03 ENCOUNTER — Ambulatory Visit (INDEPENDENT_AMBULATORY_CARE_PROVIDER_SITE_OTHER): Payer: 59

## 2014-05-03 ENCOUNTER — Other Ambulatory Visit: Payer: Self-pay | Admitting: Gynecology

## 2014-05-03 ENCOUNTER — Encounter: Payer: Self-pay | Admitting: Gynecology

## 2014-05-03 ENCOUNTER — Other Ambulatory Visit: Payer: 59

## 2014-05-03 ENCOUNTER — Ambulatory Visit (INDEPENDENT_AMBULATORY_CARE_PROVIDER_SITE_OTHER): Payer: 59 | Admitting: Gynecology

## 2014-05-03 VITALS — BP 112/70

## 2014-05-03 DIAGNOSIS — O200 Threatened abortion: Secondary | ICD-10-CM

## 2014-05-03 DIAGNOSIS — O26851 Spotting complicating pregnancy, first trimester: Secondary | ICD-10-CM

## 2014-05-03 DIAGNOSIS — Z3490 Encounter for supervision of normal pregnancy, unspecified, unspecified trimester: Secondary | ICD-10-CM

## 2014-05-03 LAB — US OB TRANSVAGINAL

## 2014-05-03 MED ORDER — METOCLOPRAMIDE HCL 10 MG PO TABS: 10.0000 mg | ORAL_TABLET | Freq: Three times a day (TID) | ORAL | Status: DC

## 2014-05-03 NOTE — Progress Notes (Signed)
    33 year old now a gravida 4 para 2 AB 1who was seen in the office in November 3 stating that her last menstrual was 19th of September. In November of last year she was started on oral contraceptive pill. She stated she was taking the pill and conceived. She denies having forgot to take the pill. She's had some low abdominal bloating but no pain.   My patient's last menstrual period she would be 7-1/[redacted] weeks gestation.period Patient a quantitative beta-hCG 04/26/2014 with a value of 21,754 miu/ml  Exam: Abdomen: Soft nontender no rebound or guarding Pelvic: Bartholin urethra Skene was within normal limits Vagina: Dark brown blood was noted Cervix: No active bleeding noted Uterus 6-8 weeks size nontender Adnexa: No palpable masses or tenderness  Ultrasound today:  Viable intrauterine pregnancy seen located the fundus of the uterus. Size less than dates by 5 days. A crown-rump length consistent with 6 weeks and 5 days was noted. Fetal pole and cardiac activity were noted. Heart rate 135 bpm. Cervix was long closed. No evidence of hematoma seen. Right and ovary normal left ovary corpus luteum cyst measuring 23 x 20 mm. Yolk sac normal shape.  Assessment/plan: First trimester threatened AB at 6 weeks and 5 days by ultrasound with an estimated date of confinement 12/22/2014. Threatened AB precautions were provided. Patient currently on prenatal vitamins. Patient complaining of nausea and vomiting at times. Will call in prescription for Reglan 10 mg 1 by mouth every 6 hours when necessary. Do's and don'ts of pregnancy were discussed in [place]. Patient will be referred to my obstetrical colleagues for her prenatal care and delivery. Patient with 2 prior cesarean sections as a result of cephalopelvic disproportion in [place].

## 2014-05-04 LAB — HCG, QUANTITATIVE, PREGNANCY: hCG, Beta Chain, Quant, S: 79627.9 m[IU]/mL

## 2014-05-09 ENCOUNTER — Other Ambulatory Visit: Payer: 59

## 2014-05-09 ENCOUNTER — Ambulatory Visit: Payer: 59 | Admitting: Gynecology

## 2014-06-09 LAB — OB RESULTS CONSOLE ABO/RH: RH Type: POSITIVE

## 2014-06-09 LAB — OB RESULTS CONSOLE ANTIBODY SCREEN: Antibody Screen: NEGATIVE

## 2014-06-09 LAB — OB RESULTS CONSOLE HEPATITIS B SURFACE ANTIGEN: Hepatitis B Surface Ag: NEGATIVE

## 2014-06-09 LAB — OB RESULTS CONSOLE RUBELLA ANTIBODY, IGM: Rubella: IMMUNE

## 2014-06-09 LAB — OB RESULTS CONSOLE RPR: RPR: NONREACTIVE

## 2014-06-09 LAB — OB RESULTS CONSOLE GC/CHLAMYDIA: GC PROBE AMP, GENITAL: NEGATIVE

## 2014-06-09 LAB — OB RESULTS CONSOLE HIV ANTIBODY (ROUTINE TESTING): HIV: NONREACTIVE

## 2014-06-09 LAB — OB RESULTS CONSOLE VARICELLA ZOSTER ANTIBODY, IGG: Varicella: IMMUNE

## 2014-09-15 LAB — OB RESULTS CONSOLE HIV ANTIBODY (ROUTINE TESTING): HIV: NONREACTIVE

## 2014-10-12 ENCOUNTER — Other Ambulatory Visit: Payer: Self-pay | Admitting: Obstetrics and Gynecology

## 2014-10-12 ENCOUNTER — Encounter: Payer: Medicaid Other | Attending: Obstetrics and Gynecology

## 2014-10-12 VITALS — Ht 65.0 in | Wt 159.6 lb

## 2014-10-12 DIAGNOSIS — R7302 Impaired glucose tolerance (oral): Secondary | ICD-10-CM | POA: Diagnosis not present

## 2014-10-12 DIAGNOSIS — Z713 Dietary counseling and surveillance: Secondary | ICD-10-CM | POA: Diagnosis not present

## 2014-10-12 DIAGNOSIS — R7309 Other abnormal glucose: Secondary | ICD-10-CM

## 2014-10-12 NOTE — Progress Notes (Signed)
  Patient was seen on 10/12/14 for Gestational Diabetes self-management class [facility]. The following learning objectives were met by the patient during this course:   States the definition of Gestational Diabetes  States why dietary management is important in controlling blood glucose  Describes the effects each nutrient has on blood glucose levels  Demonstrates ability to create a balanced meal plan  Demonstrates carbohydrate counting   States when to check blood glucose levels  Demonstrates proper blood glucose monitoring techniques  States the effect of stress and exercise on blood glucose levels  States the importance of limiting caffeine and abstaining from alcohol and smoking  Blood glucose monitor given:  One Touch Ultra Mini Self Monitoring Kit Lot # [id number] X Exp: 02/2015 Blood glucose reading: 169 mg/dl  Patient instructed to monitor glucose levels: FBS: 60 - <90 1 hour: <140 2 hour: <120  *Patient received handouts:  Nutrition Diabetes and Pregnancy  Carbohydrate Counting List  Patient will be seen for follow-up as needed.

## 2014-10-14 ENCOUNTER — Other Ambulatory Visit: Payer: Self-pay | Admitting: Obstetrics and Gynecology

## 2014-10-26 ENCOUNTER — Other Ambulatory Visit: Payer: Self-pay | Admitting: Obstetrics and Gynecology

## 2014-12-08 ENCOUNTER — Encounter (HOSPITAL_COMMUNITY): Payer: Self-pay

## 2014-12-09 ENCOUNTER — Encounter (HOSPITAL_COMMUNITY)
Admission: RE | Admit: 2014-12-09 | Discharge: 2014-12-09 | Disposition: A | Discharge status: Home / Self Care | Payer: Medicaid Other | Source: Ambulatory Visit | Attending: Obstetrics and Gynecology | Admitting: Obstetrics and Gynecology

## 2014-12-09 ENCOUNTER — Encounter (HOSPITAL_COMMUNITY): Payer: Self-pay

## 2014-12-09 DIAGNOSIS — Z01818 Encounter for other preprocedural examination: Secondary | ICD-10-CM | POA: Diagnosis present

## 2014-12-09 LAB — CBC
HCT: 37.8 % (ref 36.0–46.0)
Hemoglobin: 13.2 g/dL (ref 12.0–15.0)
MCH: 32.3 pg (ref 26.0–34.0)
MCHC: 34.9 g/dL (ref 30.0–36.0)
MCV: 92.4 fL (ref 78.0–100.0)
Platelets: 263 10*3/uL (ref 150–400)
RBC: 4.09 MIL/uL (ref 3.87–5.11)
RDW: 14.3 % (ref 11.5–15.5)
WBC: 10.5 10*3/uL (ref 4.0–10.5)

## 2014-12-09 LAB — BASIC METABOLIC PANEL
Anion gap: 6 (ref 5–15)
BUN: 11 mg/dL (ref 6–20)
CHLORIDE: 107 mmol/L (ref 101–111)
CO2: 21 mmol/L — ABNORMAL LOW (ref 22–32)
Calcium: 9.1 mg/dL (ref 8.9–10.3)
Creatinine, Ser: 0.51 mg/dL (ref 0.44–1.00)
GFR calc Af Amer: >60 mL/min (ref >60)
GFR calc non Af Amer: >60 mL/min (ref >60)
GLUCOSE: 110 mg/dL — AB (ref 65–99)
POTASSIUM: 3.8 mmol/L (ref 3.5–5.1)
Sodium: 134 mmol/L — ABNORMAL LOW (ref 135–145)

## 2014-12-09 LAB — ABO/RH: ABO/RH(D): B POS

## 2014-12-09 NOTE — Patient Instructions (Signed)
Your procedure is scheduled on:12/12/14  Enter through the Main Entrance at :7:45 am Pick up desk phone and dial [phone] and inform us of your arrival.  Please call [phone] if you have any problems the morning of surgery.  Remember: Do not eat food or drink liquids, including water, after midnight:Sunday    You may brush your teeth the morning of surgery.   DO NOT wear jewelry, eye make-up, lipstick,body lotion, or dark fingernail polish.  (Polished toes are ok) You may wear deodorant.  If you are to be admitted after surgery, leave suitcase in car until your room has been assigned. Patients discharged on the day of surgery will not be allowed to drive home. Wear loose fitting, comfortable clothes for your ride home.

## 2014-12-10 LAB — RPR: RPR: NONREACTIVE

## 2014-12-11 NOTE — H&P (Signed)
Admission History and Physical Exam for an Obstetrics Patient  Jennifer Duarte is a 34 y.o. female, [phone], at [redacted]w[redacted]d gestation, who presents for the cesarean section and tubal sterilization. She has been followed at the [facility] and Gynecology division of [facility].  Her pregnancy has been complicated by 2 prior cesarean sections, gestational diabetes, and advanced maternal age. See history below.  OB History    Gravida Para Term Preterm AB TAB SAB Ectopic Multiple Living   Past Medical History  Diagnosis Date  . GDM (gestational diabetes mellitus)     diet controlled    No prescriptions prior to admission    Past Surgical History  Procedure Laterality Date  . Cesarean section      X2  . Dilation and curettage of uterus      No Known Allergies  Family History: family history includes Hypertension in her mother.  Social History:  reports that she has quit smoking. Her smoking use included Cigarettes. She smoked 0.25 packs per day. She has never used smokeless tobacco. She reports that she drinks alcohol. She reports that she does not use illicit drugs.  Review of systems: Normal pregnancy complaints.  Admission Physical Exam:     Last menstrual period 03/12/2014.  HEENT:                 Within normal limits Chest:                   Clear Heart:                    Regular rate and rhythm Abdomen:             Gravid and nontender Extremities:          Grossly normal Neurologic exam: Grossly normal Pelvic exam:         Cervix: closed when last checked  Prenatal labs: ABO, Rh:             --/--/B POS, B POS (06/17 1520) HBsAg:                 Negative (12/17 0000)  HIV:                       Non-reactive (03/24 0000)  GBS:                      negative Antibody:              NEG (06/17 1520) Rubella:                 immune RPR:                    Non Reactive (06/17 1520)      Prenatal Transfer Tool   Maternal Diabetes: Yes:  Diabetes Type:  Insulin/Medication controlled Genetic Screening: Declined Maternal Ultrasounds/Referrals: Normal Fetal Ultrasounds or other Referrals:  None Maternal Substance Abuse:  No Significant Maternal Medications:  Meds include: Other: glyburide Significant Maternal Lab Results:  None Other Comments:  None  Assessment:  [redacted]w[redacted]d gestation  2 prior cesarean sections  Desires repeat cesarean section  Desires sterilization  Gestational diabetes  Advanced maternal age  Plan:  The patient will undergo a repeat cesarean section and tubal sterilization procedure.  She understands the indications for surgical  procedure.  She accepts the risk of, but not limited to, anesthetic complications, bleeding, infections, and possible damage to the surrounding organs.  She understands that there is a small but real failure rate associated with tubal sterilization.   Nickoles Gregori V 12/11/2014, 10:50 PM

## 2014-12-12 ENCOUNTER — Inpatient Hospital Stay (HOSPITAL_COMMUNITY): Payer: Medicaid Other | Admitting: Anesthesiology

## 2014-12-12 ENCOUNTER — Inpatient Hospital Stay (HOSPITAL_COMMUNITY)
Admission: RE | Admit: 2014-12-12 | Discharge: 2014-12-15 | DRG: 766 | Disposition: A | Discharge status: Home / Self Care | Payer: Medicaid Other | Source: Ambulatory Visit | Attending: Obstetrics and Gynecology | Admitting: Obstetrics and Gynecology

## 2014-12-12 ENCOUNTER — Encounter (HOSPITAL_COMMUNITY): Payer: Self-pay

## 2014-12-12 ENCOUNTER — Encounter (HOSPITAL_COMMUNITY)
Admission: RE | Disposition: A | Discharge status: Home / Self Care | Payer: Self-pay | Source: Ambulatory Visit | Attending: Obstetrics and Gynecology

## 2014-12-12 DIAGNOSIS — O3421 Maternal care for scar from previous cesarean delivery: Principal | ICD-10-CM | POA: Diagnosis present

## 2014-12-12 DIAGNOSIS — N858 Other specified noninflammatory disorders of uterus: Secondary | ICD-10-CM | POA: Diagnosis present

## 2014-12-12 DIAGNOSIS — Z3A39 39 weeks gestation of pregnancy: Secondary | ICD-10-CM | POA: Diagnosis present

## 2014-12-12 DIAGNOSIS — Z87891 Personal history of nicotine dependence: Secondary | ICD-10-CM | POA: Diagnosis not present

## 2014-12-12 DIAGNOSIS — D649 Anemia, unspecified: Secondary | ICD-10-CM | POA: Diagnosis not present

## 2014-12-12 DIAGNOSIS — O9989 Other specified diseases and conditions complicating pregnancy, childbirth and the puerperium: Secondary | ICD-10-CM | POA: Diagnosis present

## 2014-12-12 DIAGNOSIS — O24420 Gestational diabetes mellitus in childbirth, diet controlled: Secondary | ICD-10-CM | POA: Diagnosis present

## 2014-12-12 DIAGNOSIS — R222 Localized swelling, mass and lump, trunk: Secondary | ICD-10-CM | POA: Diagnosis present

## 2014-12-12 DIAGNOSIS — Z302 Encounter for sterilization: Secondary | ICD-10-CM

## 2014-12-12 DIAGNOSIS — O09523 Supervision of elderly multigravida, third trimester: Secondary | ICD-10-CM | POA: Diagnosis not present

## 2014-12-12 DIAGNOSIS — O9081 Anemia of the puerperium: Secondary | ICD-10-CM | POA: Diagnosis not present

## 2014-12-12 LAB — GLUCOSE, CAPILLARY
Glucose-Capillary: 77 mg/dL (ref 65–99)
Glucose-Capillary: 103 mg/dL — ABNORMAL HIGH (ref 65–99)

## 2014-12-12 LAB — PREPARE RBC (CROSSMATCH)

## 2014-12-12 SURGERY — Surgical Case
Anesthesia: Spinal | Laterality: Bilateral

## 2014-12-12 MED ORDER — WITCH HAZEL-GLYCERIN EX PADS: 1.0000 "application " | MEDICATED_PAD | CUTANEOUS | Status: DC | PRN

## 2014-12-12 MED ORDER — MEASLES, MUMPS & RUBELLA VAC ~~LOC~~ INJ
0.5000 mL | INJECTION | Freq: Once | SUBCUTANEOUS | Status: DC
  Filled 2014-12-12: qty 0.5

## 2014-12-12 MED ORDER — PHENYLEPHRINE 40 MCG/ML (10ML) SYRINGE FOR IV PUSH (FOR BLOOD PRESSURE SUPPORT)
PREFILLED_SYRINGE | INTRAVENOUS | Status: AC
  Filled 2014-12-12: qty 10

## 2014-12-12 MED ORDER — NALOXONE HCL 1 MG/ML IJ SOLN: 1.0000 ug/kg/h | INTRAVENOUS | Status: DC | PRN

## 2014-12-12 MED ORDER — HYDROMORPHONE HCL 1 MG/ML IJ SOLN: 0.2500 mg | INTRAMUSCULAR | Status: DC | PRN

## 2014-12-12 MED ORDER — LACTATED RINGERS IV SOLN
40.0000 [IU] | INTRAVENOUS | Status: DC | PRN
  Administered 2014-12-12: 40 [IU] via INTRAVENOUS

## 2014-12-12 MED ORDER — IBUPROFEN 600 MG PO TABS
600.0000 mg | ORAL_TABLET | Freq: Four times a day (QID) | ORAL | Status: DC
Start: 2014-12-12 — End: 2014-12-15
  Administered 2014-12-12 – 2014-12-15 (×12): 600 mg via ORAL
  Filled 2014-12-12 (×12): qty 1

## 2014-12-12 MED ORDER — MORPHINE SULFATE (PF) 0.5 MG/ML IJ SOLN
INTRAMUSCULAR | Status: DC | PRN
  Administered 2014-12-12: .1 mg via INTRATHECAL

## 2014-12-12 MED ORDER — NALBUPHINE HCL 10 MG/ML IJ SOLN: 5.0000 mg | Freq: Once | INTRAMUSCULAR | Status: AC | PRN

## 2014-12-12 MED ORDER — LACTATED RINGERS IV SOLN
Freq: Once | INTRAVENOUS | Status: AC
  Administered 2014-12-12: 08:00:00 via INTRAVENOUS

## 2014-12-12 MED ORDER — BUPIVACAINE-EPINEPHRINE (PF) 0.5% -1:200000 IJ SOLN
INTRAMUSCULAR | Status: AC
  Filled 2014-12-12: qty 30

## 2014-12-12 MED ORDER — LANOLIN HYDROUS EX OINT: 1.0000 "application " | TOPICAL_OINTMENT | CUTANEOUS | Status: DC | PRN

## 2014-12-12 MED ORDER — SODIUM CHLORIDE 0.9 % IJ SOLN: 3.0000 mL | INTRAMUSCULAR | Status: DC | PRN

## 2014-12-12 MED ORDER — 0.9 % SODIUM CHLORIDE (POUR BTL) OPTIME
TOPICAL | Status: DC | PRN
  Administered 2014-12-12: 1000 mL

## 2014-12-12 MED ORDER — DIPHENHYDRAMINE HCL 50 MG/ML IJ SOLN
12.5000 mg | INTRAMUSCULAR | Status: DC | PRN
Start: 2014-12-12 — End: 2014-12-15

## 2014-12-12 MED ORDER — LACTATED RINGERS IV SOLN
INTRAVENOUS | Status: DC | PRN
  Administered 2014-12-12: 10:00:00 via INTRAVENOUS

## 2014-12-12 MED ORDER — TETANUS-DIPHTH-ACELL PERTUSSIS 5-2.5-18.5 LF-MCG/0.5 IM SUSP: 0.5000 mL | Freq: Once | INTRAMUSCULAR | Status: DC

## 2014-12-12 MED ORDER — OXYCODONE-ACETAMINOPHEN 5-325 MG PO TABS
1.0000 | ORAL_TABLET | ORAL | Status: DC | PRN
  Administered 2014-12-13 – 2014-12-14 (×3): 1 via ORAL
  Filled 2014-12-12 (×3): qty 1

## 2014-12-12 MED ORDER — SIMETHICONE 80 MG PO CHEW
80.0000 mg | CHEWABLE_TABLET | ORAL | Status: DC
  Administered 2014-12-12 – 2014-12-15 (×3): 80 mg via ORAL
  Filled 2014-12-12 (×3): qty 1

## 2014-12-12 MED ORDER — BUPIVACAINE-EPINEPHRINE 0.5% -1:200000 IJ SOLN
INTRAMUSCULAR | Status: DC | PRN
  Administered 2014-12-12: 10 mL

## 2014-12-12 MED ORDER — LACTATED RINGERS IV SOLN
INTRAVENOUS | Status: DC | PRN
  Administered 2014-12-12 (×3): via INTRAVENOUS

## 2014-12-12 MED ORDER — MENTHOL 3 MG MT LOZG
1.0000 | LOZENGE | OROMUCOSAL | Status: DC | PRN
Start: 2014-12-12 — End: 2014-12-15

## 2014-12-12 MED ORDER — PRENATAL MULTIVITAMIN CH
1.0000 | ORAL_TABLET | Freq: Every day | ORAL | Status: DC
  Administered 2014-12-13 – 2014-12-15 (×3): 1 via ORAL
  Filled 2014-12-12 (×3): qty 1

## 2014-12-12 MED ORDER — NALBUPHINE HCL 10 MG/ML IJ SOLN: 5.0000 mg | INTRAMUSCULAR | Status: DC | PRN

## 2014-12-12 MED ORDER — OXYTOCIN 40 UNITS IN LACTATED RINGERS INFUSION - SIMPLE MED: 62.5000 mL/h | INTRAVENOUS | Status: AC

## 2014-12-12 MED ORDER — SCOPOLAMINE 1 MG/3DAYS TD PT72
MEDICATED_PATCH | TRANSDERMAL | Status: AC
  Administered 2014-12-12: 1.5 mg via TRANSDERMAL
  Filled 2014-12-12: qty 1

## 2014-12-12 MED ORDER — MORPHINE SULFATE 0.5 MG/ML IJ SOLN
INTRAMUSCULAR | Status: AC
  Filled 2014-12-12: qty 10

## 2014-12-12 MED ORDER — NALOXONE HCL 0.4 MG/ML IJ SOLN: 0.4000 mg | INTRAMUSCULAR | Status: DC | PRN

## 2014-12-12 MED ORDER — NALBUPHINE HCL 10 MG/ML IJ SOLN
5.0000 mg | Freq: Once | INTRAMUSCULAR | Status: AC | PRN
  Administered 2014-12-12: 5 mg via SUBCUTANEOUS

## 2014-12-12 MED ORDER — DIPHENHYDRAMINE HCL 25 MG PO CAPS: 25.0000 mg | ORAL_CAPSULE | ORAL | Status: DC | PRN

## 2014-12-12 MED ORDER — LACTATED RINGERS IV SOLN
INTRAVENOUS | Status: DC
  Administered 2014-12-12: 14:00:00 via INTRAVENOUS

## 2014-12-12 MED ORDER — SCOPOLAMINE 1 MG/3DAYS TD PT72
1.0000 | MEDICATED_PATCH | Freq: Once | TRANSDERMAL | Status: DC
  Administered 2014-12-12: 1.5 mg via TRANSDERMAL

## 2014-12-12 MED ORDER — ZOLPIDEM TARTRATE 5 MG PO TABS
5.0000 mg | ORAL_TABLET | Freq: Every evening | ORAL | Status: DC | PRN
Start: 2014-12-12 — End: 2014-12-15

## 2014-12-12 MED ORDER — ACETAMINOPHEN 325 MG PO TABS
650.0000 mg | ORAL_TABLET | ORAL | Status: DC | PRN
Start: 2014-12-12 — End: 2014-12-15

## 2014-12-12 MED ORDER — CEFAZOLIN SODIUM-DEXTROSE 2-3 GM-% IV SOLR
INTRAVENOUS | Status: AC
  Filled 2014-12-12: qty 50

## 2014-12-12 MED ORDER — PHENYLEPHRINE 8 MG IN D5W 100 ML (0.08MG/ML) PREMIX OPTIME
INJECTION | INTRAVENOUS | Status: DC | PRN
  Administered 2014-12-12: 60 ug/min via INTRAVENOUS

## 2014-12-12 MED ORDER — MEPERIDINE HCL 25 MG/ML IJ SOLN: 6.2500 mg | INTRAMUSCULAR | Status: DC | PRN

## 2014-12-12 MED ORDER — SCOPOLAMINE 1 MG/3DAYS TD PT72: 1.0000 | MEDICATED_PATCH | Freq: Once | TRANSDERMAL | Status: DC

## 2014-12-12 MED ORDER — ACETAMINOPHEN 500 MG PO TABS
1000.0000 mg | ORAL_TABLET | Freq: Four times a day (QID) | ORAL | Status: AC
  Administered 2014-12-12 – 2014-12-13 (×2): 1000 mg via ORAL
  Filled 2014-12-12 (×2): qty 2

## 2014-12-12 MED ORDER — SIMETHICONE 80 MG PO CHEW
80.0000 mg | CHEWABLE_TABLET | Freq: Three times a day (TID) | ORAL | Status: DC
  Administered 2014-12-12 – 2014-12-15 (×8): 80 mg via ORAL
  Filled 2014-12-12 (×8): qty 1

## 2014-12-12 MED ORDER — PHENYLEPHRINE 8 MG IN D5W 100 ML (0.08MG/ML) PREMIX OPTIME
INJECTION | INTRAVENOUS | Status: AC
  Filled 2014-12-12: qty 100

## 2014-12-12 MED ORDER — DIBUCAINE 1 % RE OINT: 1.0000 "application " | TOPICAL_OINTMENT | RECTAL | Status: DC | PRN

## 2014-12-12 MED ORDER — FENTANYL CITRATE (PF) 100 MCG/2ML IJ SOLN
INTRAMUSCULAR | Status: AC
  Filled 2014-12-12: qty 2

## 2014-12-12 MED ORDER — SIMETHICONE 80 MG PO CHEW: 80.0000 mg | CHEWABLE_TABLET | ORAL | Status: DC | PRN

## 2014-12-12 MED ORDER — MEDROXYPROGESTERONE ACETATE 150 MG/ML IM SUSP: 150.0000 mg | INTRAMUSCULAR | Status: DC | PRN

## 2014-12-12 MED ORDER — ACETAMINOPHEN 160 MG/5ML PO SOLN
ORAL | Status: AC
  Administered 2014-12-12: 975 mg via ORAL
  Filled 2014-12-12: qty 40.6

## 2014-12-12 MED ORDER — ONDANSETRON HCL 4 MG/2ML IJ SOLN
INTRAMUSCULAR | Status: DC | PRN
  Administered 2014-12-12: 4 mg via INTRAVENOUS

## 2014-12-12 MED ORDER — CEFAZOLIN SODIUM-DEXTROSE 2-3 GM-% IV SOLR
2.0000 g | INTRAVENOUS | Status: AC
  Administered 2014-12-12: 2 g via INTRAVENOUS

## 2014-12-12 MED ORDER — OXYCODONE-ACETAMINOPHEN 5-325 MG PO TABS
2.0000 | ORAL_TABLET | ORAL | Status: DC | PRN
  Administered 2014-12-13: 2 via ORAL
  Filled 2014-12-12: qty 2

## 2014-12-12 MED ORDER — FENTANYL CITRATE (PF) 100 MCG/2ML IJ SOLN
INTRAMUSCULAR | Status: DC | PRN
  Administered 2014-12-12: 25 ug via INTRATHECAL

## 2014-12-12 MED ORDER — ACETAMINOPHEN 160 MG/5ML PO SOLN
975.0000 mg | Freq: Once | ORAL | Status: AC
  Administered 2014-12-12: 975 mg via ORAL

## 2014-12-12 MED ORDER — ONDANSETRON HCL 4 MG/2ML IJ SOLN: 4.0000 mg | Freq: Three times a day (TID) | INTRAMUSCULAR | Status: DC | PRN

## 2014-12-12 MED ORDER — GLYCOPYRROLATE 0.2 MG/ML IJ SOLN
INTRAMUSCULAR | Status: DC | PRN
  Administered 2014-12-12: 0.2 mg via INTRAVENOUS

## 2014-12-12 MED ORDER — DIPHENHYDRAMINE HCL 25 MG PO CAPS
25.0000 mg | ORAL_CAPSULE | Freq: Four times a day (QID) | ORAL | Status: DC | PRN
  Administered 2014-12-12: 25 mg via ORAL
  Filled 2014-12-12: qty 1

## 2014-12-12 MED ORDER — ONDANSETRON HCL 4 MG/2ML IJ SOLN
INTRAMUSCULAR | Status: AC
  Filled 2014-12-12: qty 2

## 2014-12-12 MED ORDER — GLYCOPYRROLATE 0.2 MG/ML IJ SOLN
INTRAMUSCULAR | Status: AC
  Filled 2014-12-12: qty 1

## 2014-12-12 MED ORDER — NALBUPHINE HCL 10 MG/ML IJ SOLN
INTRAMUSCULAR | Status: AC
  Filled 2014-12-12: qty 1

## 2014-12-12 MED ORDER — SENNOSIDES-DOCUSATE SODIUM 8.6-50 MG PO TABS
2.0000 | ORAL_TABLET | ORAL | Status: DC
  Administered 2014-12-12 – 2014-12-15 (×3): 2 via ORAL
  Filled 2014-12-12 (×3): qty 2

## 2014-12-12 MED ORDER — OXYTOCIN 10 UNIT/ML IJ SOLN
INTRAMUSCULAR | Status: AC
  Filled 2014-12-12: qty 4

## 2014-12-12 MED ORDER — DEXAMETHASONE SODIUM PHOSPHATE 10 MG/ML IJ SOLN
INTRAMUSCULAR | Status: AC
Start: 2014-12-12 — End: 2014-12-12
  Filled 2014-12-12: qty 1

## 2014-12-12 SURGICAL SUPPLY — 39 items
CLAMP CORD UMBIL (MISCELLANEOUS) IMPLANT
CLOTH BEACON ORANGE TIMEOUT ST (SAFETY) ×3 IMPLANT
CONTAINER PREFILL 10% NBF 15ML (MISCELLANEOUS) ×3 IMPLANT
DRAIN JACKSON PRT FLT 7MM (DRAIN) IMPLANT
DRAPE SHEET LG 3/4 BI-LAMINATE (DRAPES) ×3 IMPLANT
DRSG OPSITE POSTOP 4X10 (GAUZE/BANDAGES/DRESSINGS) ×3 IMPLANT
DURAPREP 26ML APPLICATOR (WOUND CARE) ×3 IMPLANT
ELECT REM PT RETURN 9FT ADLT (ELECTROSURGICAL) ×3
ELECTRODE REM PT RTRN 9FT ADLT (ELECTROSURGICAL) ×1 IMPLANT
EVACUATOR SILICONE 100CC (DRAIN) IMPLANT
EXTRACTOR VACUUM M CUP 4 TUBE (SUCTIONS) IMPLANT
EXTRACTOR VACUUM M CUP 4' TUBE (SUCTIONS)
GLOVE BIOGEL PI IND STRL 8.5 (GLOVE) ×1 IMPLANT
GLOVE BIOGEL PI INDICATOR 8.5 (GLOVE) ×2
GLOVE ECLIPSE 8.0 STRL XLNG CF (GLOVE) ×6 IMPLANT
GOWN STRL REUS W/TWL LRG LVL3 (GOWN DISPOSABLE) ×9 IMPLANT
KIT ABG SYR 3ML LUER SLIP (SYRINGE) IMPLANT
NEEDLE HYPO 22GX1.5 SAFETY (NEEDLE) ×3 IMPLANT
NEEDLE HYPO 25X5/8 SAFETYGLIDE (NEEDLE) IMPLANT
PACK C SECTION WH (CUSTOM PROCEDURE TRAY) ×3 IMPLANT
PAD ABD 8X7 1/2 STERILE (GAUZE/BANDAGES/DRESSINGS) ×3 IMPLANT
PAD OB MATERNITY 4.3X12.25 (PERSONAL CARE ITEMS) ×3 IMPLANT
RINGERS IRRIG 1000ML POUR BTL (IV SOLUTION) ×3 IMPLANT
SPONGE GAUZE 4X4 12PLY STER LF (GAUZE/BANDAGES/DRESSINGS) ×3 IMPLANT
STAPLER VISISTAT 35W (STAPLE) IMPLANT
SUT MNCRL AB 3-0 PS2 27 (SUTURE) ×3 IMPLANT
SUT PLAIN 0 NONE (SUTURE) IMPLANT
SUT SILK 3 0 FS 1X18 (SUTURE) IMPLANT
SUT VIC AB 0 CT1 27 (SUTURE) ×4
SUT VIC AB 0 CT1 27XBRD ANBCTR (SUTURE) ×2 IMPLANT
SUT VIC AB 2-0 CTX 36 (SUTURE) ×6 IMPLANT
SUT VIC AB 3-0 CT1 27 (SUTURE) ×2
SUT VIC AB 3-0 CT1 TAPERPNT 27 (SUTURE) ×1 IMPLANT
SUT VIC AB 3-0 SH 27 (SUTURE)
SUT VIC AB 3-0 SH 27X BRD (SUTURE) IMPLANT
SYR CONTROL 10ML LL (SYRINGE) ×3 IMPLANT
TAPE CLOTH SURG 4X10 WHT LF (GAUZE/BANDAGES/DRESSINGS) ×3 IMPLANT
TOWEL OR 17X24 6PK STRL BLUE (TOWEL DISPOSABLE) ×3 IMPLANT
TRAY FOLEY CATH SILVER 14FR (SET/KITS/TRAYS/PACK) ×3 IMPLANT

## 2014-12-12 NOTE — Transfer of Care (Signed)
Immediate Anesthesia Transfer of Care Note  Patient: Jennifer Duarte  Procedure(s) Performed: Procedure(s): REPEAT CESAREAN SECTION WITH BILATERAL TUBAL LIGATION (Bilateral)  Patient Location: PACU  Anesthesia Type:Spinal  Level of Consciousness: awake, alert  and oriented  Airway & Oxygen Therapy: Patient Spontanous Breathing  Post-op Assessment: Report given to RN and Post -op Vital signs reviewed and stable  Post vital signs: Reviewed and stable  Last Vitals:  Filed Vitals:   12/12/14 0751  Pulse: 86  Temp: 36.9 C  Resp: 20    Complications: No apparent anesthesia complications

## 2014-12-12 NOTE — Addendum Note (Signed)
Addendum  created 12/12/14 1719 by Elgie Congo, CRNA   Modules edited: Notes Section   Notes Section:  File: [id number]

## 2014-12-12 NOTE — Anesthesia Postprocedure Evaluation (Signed)
  Anesthesia Post-op Note  Patient: Jennifer Duarte  Procedure(s) Performed: Procedure(s): REPEAT CESAREAN SECTION WITH BILATERAL TUBAL LIGATION (Bilateral)  Patient is awake, responsive, moving her legs, and has signs of resolution of her numbness. Pain and nausea are reasonably well controlled. Vital signs are stable and clinically acceptable. Oxygen saturation is clinically acceptable. There are no apparent anesthetic complications at this time. Patient is ready for discharge.

## 2014-12-12 NOTE — Op Note (Signed)
OPERATIVE NOTE  Patient's Name: Jennifer Duarte  Date of Birth: 12/01/1980   Medical Records Number: [id number]   Date of Operation: 12/12/2014   Preoperative diagnosis:  [redacted]w[redacted]d weeks gestation  Repeat C/Section,  Desires Repeat Cesarean Section  Desires sterilization  Postoperative diagnosis:  [redacted]w[redacted]d weeks gestation  Repeat C/Section,  Desires Repeat Cesarean Section  Desires sterilization  Procedure:  Repeat low transverse cesarean section Bilateral tubal sterilization procedure by way of bilateral salpingectomy  Surgeon:  Leonard Schwartz, M.D.  Assistant:  Firefighter, certified nurse midwife  Anesthesia:  Spinal  Disposition:  Arline Szeto is a 34 y.o. female, [redacted]w[redacted]d, who presents at [redacted]w[redacted]d weeks gestation. The patient has been followed at the [facility] obstetrics and gynecology division of [facility] care for women. This pregnancy has been complicated by a prior cesarean section. The patient desires a repeat cesarean section. She understands the indications for her procedure and she accepts the risk of, but not limited to, anesthetic complications, bleeding, infections, and possible damage to the surrounding organs. The patient desires sterilization. Sterilization options were reviewed. The patient elects to have a bilateral salpingectomy because the potential benefit of cancer reduction. She understands that there is a small failure rate associated with tubal sterilization.  Findings:  A  female Marcelle Smiling) was delivered from a OT position.  The Apgar scores were 9/9. The uterus, fallopian tubes, and ovaries were normal for the gravid state.  Procedure:  The patient was taken to the operating room where a spinal anesthetic was given.The perineum was prepped with betadine. A Foley catheter was placed in the bladder.The patient's abdomen was prepped with Duraprep.   The patient was sterilely draped. A "timeout" was performed which  properly identified the patient and the correct operative procedure. The lower abdomen was injected with half percent Marcaine with epinephrine. A low transverse incision was made in the abdomen and carried sharply through the subcutaneous tissue, the fascia, and the anterior peritoneum. An incision was made in the lower uterine segment. The incision was extended in a low transverse fashion. The membranes were ruptured. The fetal head was delivered without difficulty. The mouth and nose were suctioned. The remainder of the infant was then delivered. The cord was clamped and cut. The infant was handed to the awaiting pediatric team. The placenta was removed. The uterine cavity was cleaned of amniotic fluid, clotted blood, and membranes. The uterine incision was closed using a running locking suture of 2-0 Vicryl. An imbricating suture of 2-0 Vicryl was placed. The pelvis was vigorously irrigated. Hemostasis was adequate. The left fallopian tube was identified and followed to its fimbriated end. The mesosalpinx was clamped and cut. A free tie and then a suture ligature were then placed. Hemostasis was noted to be adequate. An identical procedure was carried out on the opposite side. The anterior peritoneum and the abdominal musculature were closed using 2-0 Vicryl. The fascia was closed using a running suture of 0 Vicryl followed by 3 interrupted sutures of 0 Vicryl. The subcutaneous layer was closed using interrupted sutures of 2-0 Vicryl. The skin was reapproximated using a subcuticular suture of 3-0 Monocryl. Sponge, needle, and instrument counts were correct on 2 occasions. The estimated blood loss for the procedure was 700 cc. The patient tolerated her procedure well. She was transported to the recovery room in stable condition. The infant remained in the operating room with the mother for bonding. The placenta was sent to labor and delivery. Both fallopian tubes were sent to pathology.  Leonard Schwartz,  M.D. 12/12/2014

## 2014-12-12 NOTE — Lactation Note (Signed)
This note was copied from the chart of Jennifer Sharene Pinkham. Lactation Consultation Note  Baby sleeping in mother's arms. P3, Breastfed last child for 3-6 months.  Was supplementing and supply went down. Reviewed hand expression and mother had good flow of colostrum. Discussed cluster feeding and supply and demand.  Encouraged STS. Mom encouraged to feed baby 8-12 times/24 hours and with feeding cues.  Mom made aware of O/P services, breastfeeding support groups, community resources, and our phone # for post-discharge questions.     Patient Name: Jennifer Duarte Today's Date: 12/12/2014 Reason for consult: Initial assessment   Maternal Data Has patient been taught Hand Expression?: Yes Does the patient have breastfeeding experience prior to this delivery?: Yes  Feeding    LATCH Score/Interventions                      Lactation Tools Discussed/Used     Consult Status Consult Status: Follow-up Date: 12/13/14 Follow-up type: In-patient    Dahlia Byes [facility] 12/12/2014, 5:00 PM

## 2014-12-12 NOTE — Anesthesia Preprocedure Evaluation (Signed)
Anesthesia Evaluation  Patient identified by MRN, date of birth, ID band Patient awake    Reviewed: Allergy & Precautions, H&P , Patient's Chart, lab work & pertinent test results  Airway Mallampati: II  TM Distance: >3 FB Neck ROM: full    Dental no notable dental hx.    Pulmonary former smoker,  breath sounds clear to auscultation  Pulmonary exam normal       Cardiovascular Exercise Tolerance: Good Rhythm:regular Rate:Normal     Neuro/Psych    GI/Hepatic   Endo/Other  diabetes, Gestational  Renal/GU      Musculoskeletal   Abdominal   Peds  Hematology   Anesthesia Other Findings   Reproductive/Obstetrics                             Anesthesia Physical Anesthesia Plan  ASA: II  Anesthesia Plan: Spinal   Post-op Pain Management:    Induction:   Airway Management Planned:   Additional Equipment:   Intra-op Plan:   Post-operative Plan:   Informed Consent: I have reviewed the patients History and Physical, chart, labs and discussed the procedure including the risks, benefits and alternatives for the proposed anesthesia with the patient or authorized representative who has indicated his/her understanding and acceptance.   Dental Advisory Given  Plan Discussed with: CRNA  Anesthesia Plan Comments: (Lab work confirmed with CRNA in room. Platelets okay. Discussed spinal anesthetic, and patient consents to the procedure:  included risk of possible headache,backache, failed block, allergic reaction, and nerve injury. This patient was asked if she had any questions or concerns before the procedure started. )        Anesthesia Quick Evaluation

## 2014-12-12 NOTE — H&P (Signed)
The patient was interviewed and examined today.  The previously documented history and physical examination was reviewed. There are no changes. The operative procedure was reviewed. The risks and benefits were outlined again. The specific risks include, but are not limited to, anesthetic complications, bleeding, infections, and possible damage to the surrounding organs. The patient's questions were answered.  We are ready to proceed as outlined. The likelihood of the patient achieving the goals of this procedure is very likely.  Pulse 86  Temp(Src) 98.4 F (36.9 C) (Oral)  Resp 20  SpO2 100%  LMP 03/12/2014 (Exact Date)  Results for orders placed or performed during the hospital encounter of 12/12/14 (from the past 24 hour(s))  Glucose, capillary     Status: None   Collection Time: 12/12/14  8:10 AM  Result Value Ref Range   Glucose-Capillary 77 65 - 99 mg/dL   CBC    Component Value Date/Time   WBC 10.5 12/09/2014 1520   RBC 4.09 12/09/2014 1520   HGB 13.2 12/09/2014 1520   HCT 37.8 12/09/2014 1520   PLT 263 12/09/2014 1520   MCV 92.4 12/09/2014 1520   MCH 32.3 12/09/2014 1520   MCHC 34.9 12/09/2014 1520   RDW 14.3 12/09/2014 1520   LYMPHSABS 3.3 10/19/2013 1226   MONOABS 0.4 10/19/2013 1226   EOSABS 0.2 10/19/2013 1226   BASOSABS 0.0 10/19/2013 1226       Leonard Schwartz, M.D.

## 2014-12-12 NOTE — Anesthesia Procedure Notes (Signed)

## 2014-12-12 NOTE — Lactation Note (Signed)
This note was copied from the chart of Jennifer Duarte. Lactation Consultation Note  RN is concerned that the baby is sleepy and not latching. Encouraged hand expression and spoon feeding to give her calories. Patient Name: Jennifer Okie Kaska Today's Date: 12/12/2014     Maternal Data    Feeding Feeding Type: Breast Fed  LATCH Score/Interventions                      Lactation Tools Discussed/Used     Consult Status      Soyla Dryer 12/12/2014, 9:42 PM

## 2014-12-12 NOTE — Anesthesia Postprocedure Evaluation (Signed)
  Anesthesia Post-op Note  Patient: Jennifer Duarte  Procedure(s) Performed: Procedure(s): REPEAT CESAREAN SECTION WITH BILATERAL TUBAL LIGATION (Bilateral)  Patient Location: PACU and Mother/Baby  Anesthesia Type:Spinal  Level of Consciousness: awake, alert  and oriented  Airway and Oxygen Therapy: Patient Spontanous Breathing  Post-op Pain: none  Post-op Assessment: Post-op Vital signs reviewed and Patient's Cardiovascular Status Stable LLE Motor Response: Purposeful movement LLE Sensation: Decreased RLE Motor Response: Purposeful movement RLE Sensation: Decreased      Post-op Vital Signs: Reviewed and stable  Last Vitals:  Filed Vitals:   12/12/14 1610  BP: 107/68  Pulse: 68  Temp: 36.8 C  Resp: 18    Complications: No apparent anesthesia complications

## 2014-12-13 ENCOUNTER — Encounter (HOSPITAL_COMMUNITY): Payer: Self-pay | Admitting: Obstetrics and Gynecology

## 2014-12-13 LAB — TYPE AND SCREEN
ABO/RH(D): B POS
ANTIBODY SCREEN: NEGATIVE
Unit division: 0
Unit division: 0

## 2014-12-13 LAB — CBC
HCT: 27.6 % — ABNORMAL LOW (ref 36.0–46.0)
HEMOGLOBIN: 9.5 g/dL — AB (ref 12.0–15.0)
MCH: 31.9 pg (ref 26.0–34.0)
MCHC: 34.4 g/dL (ref 30.0–36.0)
MCV: 92.6 fL (ref 78.0–100.0)
Platelets: 211 10*3/uL (ref 150–400)
RBC: 2.98 MIL/uL — ABNORMAL LOW (ref 3.87–5.11)
RDW: 14.1 % (ref 11.5–15.5)
WBC: 12.1 10*3/uL — ABNORMAL HIGH (ref 4.0–10.5)

## 2014-12-13 LAB — GLUCOSE, RANDOM: Glucose, Bld: 117 mg/dL — ABNORMAL HIGH (ref 65–99)

## 2014-12-13 LAB — GLUCOSE, CAPILLARY
Glucose-Capillary: 129 mg/dL — ABNORMAL HIGH (ref 65–99)
Glucose-Capillary: 133 mg/dL — ABNORMAL HIGH (ref 65–99)

## 2014-12-13 LAB — BIRTH TISSUE RECOVERY COLLECTION (PLACENTA DONATION)

## 2014-12-13 NOTE — Progress Notes (Addendum)
Subjective: Postpartum Day 1: Cesarean Delivery due to repeat w/BTL Patient up ad lib, reports no syncope or dizziness. Feeding:  breast Contraceptive plan:  BTL Pt c/o lump on right scapular approximately 7-8cm in diameter, she only noticed while scratching    Objective: Vital signs in last 24 hours: Temp:  [97.2 F (36.2 C)-99.6 F (37.6 C)] 98.4 F (36.9 C) (06/21 0551) Pulse Rate:  [67-96] 91 (06/21 0551) Resp:  [17-21] 18 (06/21 0551) BP: (91-115)/(55-89) 106/55 mmHg (06/21 0551) SpO2:  [96 %-100 %] 100 % (06/21 0551)  Physical Exam:  General: alert and cooperative Lochia: appropriate Uterine Fundus: firm Abdomen:  + bowel sounds, non distended Incision: no significant drainage  Honeycomb dressing CDI DVT Evaluation: No evidence of DVT seen on physical exam. Homan's sign: Negative   Recent Labs  12/13/14 0515  HGB 9.5*  HCT 27.6*  WBC 12.1*   Orthostatics stable.  (Area on back examined - soft, NT, no erythema and no discrete mass)  Assessment: Status post Cesarean section day 1. Doing well postoperatively.  Honeycomb dressing in place, no significant drainage Anemia - hemodynamicly stable.  Declines transfusion lumb on her back appear to have been there for a while.  No swelling, redness or tenderness notes.  (I recommend observing area on back and if increases in size or changes, will eval further but it seems like a benign process.  Perhaps a bit of inflammation after itching or perhaps even some extra SQ fatty tissue but completely asymptomatic now. Jannetta Quint)  Plan: Continue current care. Breastfeeding and Lactation consult   Venus Standard, CNM, MSN 12/13/2014. 7:39 AM

## 2014-12-14 LAB — GLUCOSE, CAPILLARY: Glucose-Capillary: 95 mg/dL (ref 65–99)

## 2014-12-14 NOTE — Progress Notes (Signed)
Jennifer Duarte [id number]  Subjective: Postpartum Day 2: Repeat C/S and BTL, scheduled Patient up ad lib, reports no syncope or dizziness.  More sore today than yesterday, pain med helpful. Feeding:  Breast Contraceptive plan:  BTL with C/S  Objective: Temp:  [97.5 F (36.4 C)-98.3 F (36.8 C)] 97.5 F (36.4 C) (06/22 0641) Pulse Rate:  [64-74] 64 (06/22 0641) Resp:  [18] 18 (06/22 0641) BP: (101-124)/(71-89) 124/89 mmHg (06/22 0641) SpO2:  [100 %] 100 % (06/22 0641)  CBC Latest Ref Rng 12/13/2014 12/09/2014 10/19/2013  WBC 4.0 - 10.5 K/uL 12.1(H) 10.5 10.9(H)  Hemoglobin 12.0 - 15.0 g/dL 0.0(F) 11.0 21.1  Hematocrit 36.0 - 46.0 % 27.6(L) 37.8 42.5  Platelets 150 - 400 K/uL 211 263 349     Physical Exam:  General: alert Lochia: appropriate Uterine Fundus: firm Abdomen:  + bowel sounds Incision: Honeycomb dressing CDI DVT Evaluation: No evidence of DVT seen on physical exam. Negative Homan's sign.   Assessment/Plan: Status post repeat cesarean delivery with BTL, day 2. Stable Continue current care. Plan for discharge tomorrow    Nigel Bridgeman MSN, CNM 12/14/2014, 12:04 PM

## 2014-12-14 NOTE — Lactation Note (Signed)
This note was copied from the chart of Jennifer Duarte. Lactation Consultation Note Follow up appt. At 60 hours.  Baby has been latched for 10 minutes and now has a shallow latch.  Mom does report pain with latch. Encouraged mom to allow baby to open mouth wide before latch.  Mom is not following directions and continues to allow baby to suck on tip of nipple.  Mom pleasant, but not receptive to teaching on deep latch.  Mom is syringe feeding with formula at the breast.  Mom reports pumping once today.  Encouraged to keep pumping every 3 hours.  Encouraged mom to increase supplement to 18-25 mls per hours of age.  Mom will call for assist as needed.      Patient Name: Jennifer Jayse Hammond [id number] Date: 12/14/2014 Reason for consult: Follow-up assessment   Maternal Data Has patient been taught Hand Expression?: Yes  Feeding Feeding Type: Breast Fed Length of feed: 30 min  LATCH Score/Interventions                Intervention(s): Breastfeeding basics reviewed     Lactation Tools Discussed/Used     Consult Status Consult Status: Follow-up Date: 12/15/14 Follow-up type: In-patient    [name] 12/14/2014, 10:42 PM

## 2014-12-15 LAB — GLUCOSE, CAPILLARY
Glucose-Capillary: 80 mg/dL (ref 65–99)
Glucose-Capillary: 83 mg/dL (ref 65–99)

## 2014-12-15 MED ORDER — OXYCODONE-ACETAMINOPHEN 5-325 MG PO TABS: 1.0000 | ORAL_TABLET | ORAL | Status: DC | PRN

## 2014-12-15 MED ORDER — IBUPROFEN 600 MG PO TABS: 600.0000 mg | ORAL_TABLET | Freq: Four times a day (QID) | ORAL | Status: DC | PRN

## 2014-12-15 NOTE — Lactation Note (Signed)
This note was copied from the chart of Jennifer Duarte. Lactation Consultation Note  Mother recently breastfed baby aprox 20 min.Marijean Niemann RN viewed feeding.  LS7, Provided mother w/ hand pump and comfort gels. Recently she pumped 22 ml.  Plans to go to [facility] to get DEBP. Reviewed engorgement care and monitoring voids/stools. Denies questions or concerns.  Patient Name: Jennifer Duarte [id number] Date: 12/15/2014 Reason for consult: Follow-up assessment   Maternal Data    Feeding Feeding Type: Breast Fed Length of feed: 15 min  LATCH Score/Interventions Latch: Repeated attempts needed to sustain latch, nipple held in mouth throughout feeding, stimulation needed to elicit sucking reflex. Intervention(s): Adjust position;Assist with latch;Breast compression;Breast massage  Audible Swallowing: Spontaneous and intermittent Intervention(s): Hand expression  Type of Nipple: Everted at rest and after stimulation  Comfort (Breast/Nipple): Filling, red/small blisters or bruises, mild/mod discomfort  Problem noted: Mild/Moderate discomfort Interventions (Mild/moderate discomfort): Hand expression  Hold (Positioning): Assistance needed to correctly position infant at breast and maintain latch.  LATCH Score: 7  Lactation Tools Discussed/Used     Consult Status Consult Status: Complete    Hardie Pulley 12/15/2014, 11:52 AM

## 2014-12-15 NOTE — Discharge Summary (Signed)
  Cesarean Section Delivery Discharge Summary  Jennifer Duarte  DOB:    September 20, 1980 MRN:    [id number] CSN:    [id number]  Date of admission:                  12/12/14  Date of discharge:                   12/15/14  Procedures this admission:  Repeat LTCS with bilateral salpingectomy  Date of Delivery: 12/12/14  Newborn Data:  Live born female  Birth Weight: 9 lb 5.9 oz (4250 g) APGAR: 9, 9  Home with mother. Name: Jennifer Duarte   History of Present Illness:  Jennifer Duarte is a 34 y.o. female, [phone], who presents at [redacted]w[redacted]d weeks gestation. The patient has been followed at [facility] and Gynecology division of [facility]   Her pregnancy has been complicated by:  Patient Active Problem List   Diagnosis Date Noted  . Cesarean delivery delivered 12/12/2014  . Threatened miscarriage 05/03/2014  . Pregnant 04/26/2014  . Leg cramps 09/14/2012  . Weight gain 08/31/2012    Hospital Course--Scheduled Cesarean:  Admitting Dx:   IUP at 39 2/7 weeks, previous cesarean, desired repeat, desired sterilization Rationale for C/S: See above Anesthesia:  spinal Surgeon:  Dr. Stefano Gaul Complications: Mild anemia without hemodynamic instability  Intrapartum Procedures: cesarean: low cervical, transverse Postpartum Procedures: none Complications-Operative and Postpartum: none  Discharge Diagnoses: Term Pregnancy-delivered, repeat LTCS with bilateral salpingectomy  Feeding:  breast  Contraception:  bilateral tubal ligation  Hemoglobin Results:  CBC CBC Latest Ref Rng 12/13/2014 12/09/2014 10/19/2013  WBC 4.0 - 10.5 K/uL 12.1(H) 10.5 10.9(H)  Hemoglobin 12.0 - 15.0 g/dL 2.3(X) 43.5 68.6  Hematocrit 36.0 - 46.0 % 27.6(L) 37.8 42.5  Platelets 150 - 400 K/uL 211 263 349      Discharge Physical Exam:   General: alert and cooperative Lochia: appropriate Uterine Fundus: firm Abdomen:  + bowel sounds Incision: healing well, Honeycomb dressing  CDI DVT Evaluation: No evidence of DVT seen on physical exam. Negative Homan's sign.  Discharge Information:  Activity:           pelvic rest Diet:                routine Medications: Ibuprofen and Percocet Condition:      stable Instructions:  Discharge to: home  Follow-up Information    Follow up with [facility] & Gynecology. Schedule an appointment as soon as possible for a visit in 6 weeks.   Specialty:  Obstetrics and Gynecology   Why:  Call for any questions or concerns.   Contact information:   [address] [place] [address] [place] [address] [phone]       Nigel Bridgeman CNM 12/15/2014 9:07 AM

## 2014-12-15 NOTE — Discharge Instructions (Signed)

## 2016-08-22 DIAGNOSIS — N8501 Benign endometrial hyperplasia: Secondary | ICD-10-CM

## 2016-08-22 HISTORY — DX: Benign endometrial hyperplasia: N85.01

## 2016-08-23 ENCOUNTER — Ambulatory Visit (INDEPENDENT_AMBULATORY_CARE_PROVIDER_SITE_OTHER): Payer: BLUE CROSS/BLUE SHIELD | Admitting: Gynecology

## 2016-08-23 ENCOUNTER — Encounter: Payer: Self-pay | Admitting: Gynecology

## 2016-08-23 VITALS — BP 126/78 | Ht 65.0 in | Wt 135.0 lb

## 2016-08-23 DIAGNOSIS — L292 Pruritus vulvae: Secondary | ICD-10-CM

## 2016-08-23 DIAGNOSIS — Z01411 Encounter for gynecological examination (general) (routine) with abnormal findings: Secondary | ICD-10-CM

## 2016-08-23 DIAGNOSIS — N923 Ovulation bleeding: Secondary | ICD-10-CM

## 2016-08-23 DIAGNOSIS — F3281 Premenstrual dysphoric disorder: Secondary | ICD-10-CM

## 2016-08-23 DIAGNOSIS — N760 Acute vaginitis: Secondary | ICD-10-CM | POA: Diagnosis not present

## 2016-08-23 DIAGNOSIS — F17200 Nicotine dependence, unspecified, uncomplicated: Secondary | ICD-10-CM

## 2016-08-23 DIAGNOSIS — B9689 Other specified bacterial agents as the cause of diseases classified elsewhere: Secondary | ICD-10-CM

## 2016-08-23 DIAGNOSIS — N644 Mastodynia: Secondary | ICD-10-CM | POA: Diagnosis not present

## 2016-08-23 DIAGNOSIS — N898 Other specified noninflammatory disorders of vagina: Secondary | ICD-10-CM

## 2016-08-23 DIAGNOSIS — IMO0001 Reserved for inherently not codable concepts without codable children: Secondary | ICD-10-CM

## 2016-08-23 DIAGNOSIS — N920 Excessive and frequent menstruation with regular cycle: Secondary | ICD-10-CM

## 2016-08-23 LAB — WET PREP FOR TRICH, YEAST, CLUE
Trich, Wet Prep: NONE SEEN
WBC WET PREP: NONE SEEN
YEAST WET PREP: NONE SEEN

## 2016-08-23 MED ORDER — METRONIDAZOLE 500 MG PO TABS: 500.0000 mg | ORAL_TABLET | Freq: Two times a day (BID) | ORAL | 0 refills | Status: AC

## 2016-08-23 MED ORDER — FLUOXETINE HCL 10 MG PO CAPS: ORAL_CAPSULE | ORAL | 11 refills | Status: AC

## 2016-08-23 NOTE — Progress Notes (Signed)
Carolynn SayersDolores A Flores-Sanchez [birth year] [id number]   History:    36 y.o.  for annual gyn exam with several complaints today. Patient complaining of vaginal discharge with odor. Also complaining of intermenstrual bleeding and breast tenderness also complaining that 7-10 days before her menstrual cycle she becomes very irritable and has mood swings but no hot flashes. She has been pregnant 4 times at 3 children delivered by cesarean section and tubal ligation with the last one 2 years ago and had one miscarriage. Patient does smoke about a pack cigarette per week. Patient with no past history of abnormal Pap smears.  Past medical history,surgical history, family history and social history were all reviewed and documented in the EPIC chart.  Gynecologic History Patient's last menstrual period was 08/18/2016. Contraception: tubal ligation Last Pap: 2014. Results were: normal Last mammogram: Not indicated. Results were: Not indicated  Obstetric History OB History  Gravida Para Term Preterm AB Living  4 3 1   1 1   SAB TAB Ectopic Multiple Live Births  1     0 1    # Outcome Date GA Lbr Len/2nd Weight Sex Delivery Anes PTL Lv  4 Term 12/12/14 [id number]  9 lb 5.9 oz (4.25 kg) F CS-LTranv Spinal  LIV     Birth Comments: macrosomic  3 SAB           2 Para           1 Para                ROS: A ROS was performed and pertinent positives and negatives are included in the history.  GENERAL: No fevers or chills. HEENT: No change in vision, no earache, sore throat or sinus congestion. NECK: No pain or stiffness. CARDIOVASCULAR: No chest pain or pressure. No palpitations. PULMONARY: No shortness of breath, cough or wheeze. GASTROINTESTINAL: No abdominal pain, nausea, vomiting or diarrhea, melena or bright red blood per rectum. GENITOURINARY: No urinary frequency, urgency, hesitancy or dysuria. MUSCULOSKELETAL: No joint or muscle pain, no back pain, no recent trauma. DERMATOLOGIC: No rash, no itching, no  lesions. ENDOCRINE: No polyuria, polydipsia, no heat or cold intolerance. No recent change in weight. HEMATOLOGICAL: No anemia or easy bruising or bleeding. NEUROLOGIC: No headache, seizures, numbness, tingling or weakness. PSYCHIATRIC: No depression, no loss of interest in normal activity or change in sleep pattern.     Exam: chaperone present  BP 126/78   Ht 5\' 5"  (1.651 m)   Wt 135 lb (61.2 kg)   LMP 08/18/2016   BMI 22.47 kg/m   Body mass index is 22.47 kg/m.  General appearance : Well developed well nourished female. No acute distress HEENT: Eyes: no retinal hemorrhage or exudates,  Neck supple, trachea midline, no carotid bruits, no thyroidmegaly Lungs: Clear to auscultation, no rhonchi or wheezes, or rib retractions  Heart: Regular rate and rhythm, no murmurs or gallops Breast:Examined in sitting and supine position were symmetrical in appearance, no palpable masses or tenderness,  no skin retraction, no nipple inversion, no nipple discharge, no skin discoloration, no axillary or supraclavicular lymphadenopathy Abdomen: no palpable masses or tenderness, no rebound or guarding Extremities: no edema or skin discoloration or tenderness  Pelvic:  Bartholin, Urethra, Skene Glands: Within normal limits             Vagina: No gross lesions or discharge  Cervix: No gross lesions or discharge  Uterus  anteverted normal size, shape and consistency, non-tender and mobile  Adnexa  Without masses or tenderness  Anus and perineum  normal   Rectovaginal  normal sphincter tone without palpated masses or tenderness             Hemoccult not indicated   Wet prep many clue cells too numerous to count bacteria  Assessment/Plan:  36 y.o. female for annual exam will be treated for bacterial vaginosis with Flagyl 500 mg twice a day for 7 days. Because of patient intermenstrual bleeding will return to the office next week for sonohysterogram at which time she will come in a fasting state for a  CBC, fasting lipid profile and comprehensive metabolic panel, Pap smear was done today. 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 I have also counseled her on smoking and anti-smoking program literature information provided. For her PMDD and also prescribed Prozac low-dose 10 mg to take 2 weeks on 2 weeks off.  An additional 15 minutes was spent discussing diagnosis and treatment of PMDD as well as her vaginal infection and her mastodynia and causes for intermenstrual bleeding and workup needed   [name] MD, 2:04 PM 08/23/2016

## 2016-08-23 NOTE — Patient Instructions (Addendum)
Vaginosis bacteriana (Bacterial Vaginosis) La vaginosis bacteriana es una infeccin vaginal que perturba el equilibrio normal de las bacterias que se encuentran en la vagina. Es el resultado de un crecimiento excesivo de ciertas bacterias. Esta es la infeccin vaginal ms frecuente en mujeres en edad reproductiva. El tratamiento es importante para prevenir complicaciones, especialmente en mujeres embarazadas, dado que puede causar un parto prematuro. CAUSAS La vaginosis bacteriana se origina por un aumento de bacterias nocivas que, generalmente, estn presentes en cantidades ms pequeas en la vagina. Varios tipos diferentes de bacterias pueden causar esta afeccin. Sin embargo, la causa de su desarrollo no se comprende totalmente. FACTORES DE RIESGO Ciertas actividades o comportamientos pueden exponerlo a un mayor riesgo de desarrollar vaginosis bacteriana, entre los que se incluyen:  Tener una nueva pareja sexual o mltiples parejas sexuales.  Las duchas vaginales  El uso del DIU (dispositivo intrauterino) como mtodo anticonceptivo. El contagio no se produce en baos, por ropas de cama, en piscinas o por contacto con objetos. SIGNOS Y SNTOMAS Algunas mujeres que padecen vaginosis bacteriana no presentan signos ni sntomas. Los sntomas ms comunes son:  Secrecin vaginal de color grisceo.  Secrecin vaginal con olor similar al [employer], especialmente despus de Sales promotion account executive.  Picazn o sensacin de ardor en la vagina o la vulva.  Ardor o dolor al [employer]. DIAGNSTICO Su mdico analizar su historia clnica y le examinar la vagina para detectar signos de vaginosis bacteriana. Puede tomarle Lauris Poag de flujo vaginal. Su mdico examinar esta muestra con un microscopio para controlar las bacterias y clulas anormales. Tambin puede realizarse un anlisis del pH vaginal. TRATAMIENTO La vaginosis bacteriana puede tratarse con antibiticos, en forma de comprimidos o de  crema vaginal. Puede indicarse una segunda tanda de antibiticos si la afeccin se repite despus del tratamiento. Debido a que la vaginosis bacteriana aumenta el riesgo de contraer enfermedades de transmisin sexual, el tratamiento puede ayudar a reducir el riesgo de clamidia, [place], VIH y herpes. INSTRUCCIONES PARA EL CUIDADO EN EL HOGAR  Tome solo medicamentos de venta libre o recetados, segn las indicaciones del mdico.  Si le han recetado antibiticos, tmelos como se le indic. Asegrese de que finaliza la prescripcin completa aunque se sienta mejor.  Comunique a sus compaeros sexuales que sufre una infeccin vaginal. Deben consultar a su mdico y recibir tratamiento si tienen problemas, como picazn o una erupcin cutnea leve.  Durante el [place], es importante que siga estas indicaciones:  Fish farm manager relaciones sexuales o use preservativos de la forma correcta.  No se haga duchas vaginales.  Evite consumir alcohol como se lo haya indicado el mdico.  Psychologist, sport and exercise se lo haya indicado el mdico. SOLICITE ATENCIN MDICA SI:  Sus sntomas no mejoran despus de 3 das de [place].  Aumenta la secrecin o Chief Technology Officer.  Tiene fiebre. ASEGRESE DE QUE:  Comprende estas instrucciones.  Controlar su afeccin.  Recibir ayuda de inmediato si no mejora o si empeora. PARA OBTENER MS INFORMACIN Centros para el control y la prevencin de Child psychotherapist for Disease Control and Prevention, CDC): SolutionApps.co.za Asociacin Estadounidense de la Salud Sexual (American Sexual Health Association, SHA): www.ashastd.org Esta informacin no tiene Theme park manager el consejo del mdico. Asegrese de hacerle al mdico cualquier pregunta que tenga. Document Released: 09/17/2007 Document Revised: 07/01/2014 Document Reviewed: 01/20/2013 Elsevier Interactive Patient Education  2017 [employer]. Pasos para dejar de fumar (Steps to Quit Smoking) Fumar tabaco es  malo para su salud. Puede afectar a casi cualquier rgano del  cuerpo. Fumar lo pone a usted y a Magazine features editor a su alrededor en riesgo de Runner, broadcasting/film/video enfermedades graves de Continental plazo (crnicas). Dejar de fumar es difcil, pero es una de las mejores cosas que puede hacer por su salud. Nunca es muy tarde para dejar de fumar. CULES SON LOS BENEFICIOS QUE SE OBTIENEN AL DEJAR DE FUMAR? Al dejar de fumar, se reduce el riesgo de contraer enfermedades y afecciones graves. Estas pueden incluir los siguientes:  Enfermedad o cncer de pulmn.  Cardiopata coronaria.  Ictus.  Infarto de miocardio.  Imposibilidad de tener hijos (infertilidad).  Huesos dbiles (osteoporosis) y huesos rotos (fracturas). La tos, las sibilancias y la falta de aire son sntomas que mejoran cuando deja de fumar. Es posible tambin que se enferme con Scientist, research (physical sciences). Si est embarazada, dejar de fumar la ayudar a reducir las probabilidades de Warehouse manager un beb de bajo peso al nacer. QU PUEDO HACER PARA ABANDONAR EL HBITO DE FUMAR? Pregntele al [employer] cosas que pueden ayudarlo a dejar el hbito. Algunos cosas que puede hacer (estrategias) incluyen:  Dejar de fumar de forma definitiva en lugar de ir reduciendo gradualmente la cantidad de cigarrillos durante un perodo.  Recibir asesoramiento psicolgico individual. Es ms probable que tenga xito si asiste a diversas sesiones de Optometrist.  Usar recursos y sistemas de soporte, como, por ejemplo:  Charlas en lnea con un consejero.  Lneas telefnicas para dejar de fumar.  Materiales impresos de [place].  Grupos de apoyo o asesoramiento psicolgico grupal.  Programas de mensajes de texto.  Aplicaciones para telfonos celulares.  Tomar medicamentos. Algunos de estos medicamentos pueden contener nicotina. Si est embarazada o amamantando, no tome ningn medicamento para dejar de fumar, excepto que el mdico lo autorice. Hable con el  mdico sobre el asesoramiento psicolgico o sobre otras cosas que [place]. Hable con el mdico sobre usar ms de una estrategia al [employer], [place], por ejemplo, tomar medicamentos y tambin recibir asesoramiento psicolgico. Esto puede facilitarle el proceso para dejar de fumar. QU PUEDO HACER PARA QUE DEJAR DE FUMAR SEA MS FCIL? Al principio, dejar de fumar puede parecer abrumador, pero hay muchas opciones que facilitan el [place]. Tome estas medidas:  Converse con su familia o sus amigos. Busque su apoyo y [place].  Llame a las lneas telefnicas que ayudan a dejar de fumar, pngase en contacto con grupos de apoyo o reciba asesoramiento de un consejero.  Pdale a la gente que fuma que no lo haga a su alrededor.  Evite los lugares que pueden despertar el deseo de fumar (disparadores), como, por ejemplo:  Bares.  Fiestas.  reas para fumar en el trabajo.  Pase tiempo con personas que no fuman.  Disminuya todo tipo de estrs de la vida diaria. El estrs puede hacer que usted desee fumar. Pruebe estas cosas para disminuir el estrs:  Education administrator actividad fsica con regularidad.  Practicar ejercicios de respiracin profunda.  Practicar yoga.  Medite.  Realizar una visualizacin corporal. Para ello, cierre los ojos, concntrese en una zona del cuerpo a la vez desde la cabeza [employer] Corporation dedos de los pies y fjese qu partes del cuerpo estn tensas. Relaje los msculos de esas reas.  Descargue o compre aplicaciones para telfonos mviles o tabletas que le ayuden a respetar el plan para dejar de fumar. Hay muchas aplicaciones gratuitas, como QuitGuide de los Centros para el Control y la Prevencin de Event organiser (CDC, Marine scientist for [employer] and Prevention). Puede hallar otros recursos de apoyo  en smokefree.gov y en otros sitios web. Esta informacin no tiene Theme park manager el consejo del mdico. Asegrese de hacerle al mdico cualquier pregunta que tenga. Document  Released: 07/13/2010 Document Revised: 09/02/2011 Document Reviewed: 10/25/2014 Elsevier Interactive Patient Education  2017 [employer]. [place] en las mamas (Breast Tenderness) La sensibilidad en las mamas es un problema frecuente en las mujeres de todas las edades. y puede causar molestias leves o dolor intenso. Sus causas son variadas. Su mdico determinar la causa probable de la sensibilidad Education administrator examen de las [place], las preguntas United Stationers sntomas y la indicacin de algunos estudios. Por lo general, la sensibilidad en las mamas no significa que tenga cncer de mama. INSTRUCCIONES PARA EL CUIDADO EN EL HOGAR  A menudo, la sensibilidad en las mamas puede tratarse en Advice worker. Puede intentar lo siguiente:  Probarse un nuevo sostn que le brinde ms sujecin, especialmente mientras hace actividad fsica.  Usar un sostn con mejor sujecin o uno deportivo mientras duerme cuando las mamas estn muy sensibles.  Si tiene una lesin mamaria, aplique hielo en la zona:  Ponga el hielo en una bolsa plstica.  Colquese una toalla entre la piel y la bolsa de hielo.  Deje el hielo durante 20 minutos y aplquelo 2 a 3 veces por da.  Si tiene las Energy [employer] Corporation de [place] debido a la Market researcher, intente lo siguiente:  Extrigase [employer] o con un sacaleche.  Aplquese una compresa tibia en las mamas para ayudar a Manufacturing systems engineer.  Tome analgsicos de venta libre si su mdico lo autoriza.  Tome otros medicamentos que su mdico le recete, entre ellos, antibiticos o anticonceptivos. A largo plazo, puede aliviar la sensibilidad en las mamas si hace lo siguiente:  Disminuye el consumo de cafena.  Disminuye la cantidad de grasa de la dieta. Lleva un registro de [address] y las horas cuando tiene mayor sensibilidad en las [place]. Esto ser de ayuda para que usted y su mdico encuentren la causa de la sensibilidad y cmo Runner, broadcasting/film/video. Adems, aprenda cmo examinarse las mamas en casa.  Esto la ayudar a palpar un crecimiento o un bulto fuera de lo normal que podra causar la sensibilidad. SOLICITE ATENCIN MDICA SI:   Cualquier zona de la mama est dura, enrojecida y caliente al tacto. Puede ser un signo de infeccin.  Hay secrecin de los pezones (y no est amamantando). En especial, vigile la secrecin de sangre o pus.  Tiene fiebre, adems de sensibilidad en las mamas.  Tiene un bulto nuevo o doloroso en la mama que no desaparece despus de la finalizacin del perodo menstrual.  Ha intentando controlar el dolor en casa, pero no desaparece.  El dolor de la mama es ms intenso o le dificulta hacer las cosas que hace habitualmente durante el da. Esta informacin no tiene Theme park manager el consejo del mdico. Asegrese de hacerle al mdico cualquier pregunta que tenga. Document Released: 03/31/2013 Elsevier Interactive Patient Education  2017 [employer]. Fluoxetine capsules or tablets (PMDD indication) Qu es este medicamento? La FLUOXETINA pertenece a un grupo de medicamentos llamados inhibidores selectivos de la recaptacin de serotonina (ISRS). Se utiliza para el trastorno disfrico premenstrual (TDPM). El TDPM provoca sntomas intensos de [place] de nimo o fsicos una a [employer] antes de su periodo cada mes. Este medicamento ayuda a mejorar cambios de [place] de nimo, cansancio, tensin y sensibilidad de las [place]. Este medicamento puede ser utilizado para otros usos; si tiene alguna pregunta consulte con su proveedor  de atencin mdica o con su farmacutico. MARCAS COMUNES: Prozac, [place], Selfemra Qu le debo informar a mi profesional de la salud antes de tomar este medicamento? Necesitan saber si usted presenta alguno de los siguientes problemas o situaciones: trastorno bipolar o antecedentes familiares de trastorno bipolar trastornos de sangrado glaucoma enfermedad cardiaca enfermedad heptica bajos niveles de sodio en la sangre convulsiones ideas,  planes o intento de suicidio; si usted o alguien de su familia ha intentado suicidarse previamente si toma IMAO, tales como Carbex, Eldepryl, Marplan, Nardil y Parnate si toma medicamentos que tratan o previenen cogulos sanguneos enfermedad tiroidea una reaccin alrgica o inusual a la fluoxetina, a otros medicamentos, alimentos, colorantes o conservantes si est embarazada o buscando quedar embarazada si est amamantando a un beb trastorno bipolar o antecedentes familiares de trastorno bipolar trastornos de sangrado glaucoma enfermedad cardiaca enfermedad heptica bajos niveles de sodio en la sangre convulsiones ideas, planes o intento de suicidio; si usted o alguien de su familia ha intentado suicidarse previamente si toma IMAO, tales como Carbex, Eldepryl, Marplan, Nardil y Parnate si toma medicamentos que tratan o previenen cogulos sanguneos enfermedad tiroidea una reaccin alrgica o inusual a la fluoxetina, a otros medicamentos, alimentos, colorantes o conservantes si est embarazada o buscando quedar embarazada si est amamantando a un beb Cmo debo utilizar este medicamento? Tome este medicamento por va oral con un vaso de agua. Siga las instrucciones de la etiqueta del [place]medicamento. Puede tomarlo con o sin alimentos. Tome su medicamento a intervalos regulares. No lo tome con una frecuencia mayor a la indicada. No deje de tomar PPL Corporationeste medicamento de repente a menos que as lo indique su mdico. Dejar de Visual merchandiserutilizar este medicamento demasiado rpido puede causar efectos secundarios graves o podra empeorar su afeccin. Su farmacutico le dar una Gua del medicamento especial (MedGuide, nombre en ingls) con cada receta y en cada ocasin que la vuelva a surtir. Asegrese de leer esta informacin cada vez cuidadosamente. Hable con su pediatra para informarse acerca del uso de este medicamento en nios. Puede requerir atencin especial. Sobredosis: Pngase en contacto inmediatamente con un centro  toxicolgico o una sala de urgencia si usted cree que haya tomado demasiado medicamento. ATENCIN: Reynolds AmericanEste medicamento es solo para usted. No comparta este medicamento con nadie. Qu sucede si me olvido de una dosis? Si olvida una dosis, sltese la dosis Monacoolvidada y vuelva al horario habitual de sus dosis. No tome dosis adicionales o dobles. Qu puede interactuar con este medicamento? No tome este medicamento con ninguno de los siguientes frmacos: otros medicamentos que contengan fluoxetina, tales como Prozac o Symbyax cisaprida linezolida IMAO, tales como Carbex, Eldepryl, Marplan, Nardil y Parnate azul de metileno (inyectado en una vena) pimozida tioridazina Este medicamento tambin puede Product/process development scientistinteractuar con los siguientes medicamentos: alcohol anfetaminas aspirina y medicamentos tipo aspirina carbamazepina ciertos medicamentos para la depresin, ansiedad o trastornos psicticos ciertos medicamentos para la migraa, tales como almotriptn, eletriptn, frovatriptn, naratriptn, rizatriptn, sumatriptn y zolmitriptn digoxina diurticos fentanilo flecainida furazolidona isoniazida litio medicamentos para conciliar el sueo medicamentos que tratan o previenen cogulos sanguneos, como warfarina, enoxaparina y dalteparina [place]AINE, medicamentos para Chief Technology Officerel dolor y la inflamacin, como ibuprofeno o naproxeno fenitona procarbazina propafenona rasagilina ritonavir suplementos tales como hierba de [place] Lorette [place], kava kava y valeriana tramadol triptfano vinblastina Puede ser que esta lista no menciona todas las posibles interacciones. Informe a su profesional de Beazer Homesla salud de In[employer] Micro Inctodos los productos a base de hierbas, medicamentos de Crouchventa libre o suplementos nutritivos que est tomando. Si usted fuma, consume  bebidas alcohlicas o si utiliza drogas ilegales, indqueselo tambin a su profesional de [employer] Homes. Algunas sustancias pueden interactuar con su medicamento. A qu debo estar atento al usar PPL Corporation? Informe a su  mdico si sus sntomas no mejoran o si empeoran. Visite a su mdico o a su profesional de la salud para chequear su evolucin peridicamente. Debido que puede ser necesario tomar este medicamento durante varias semanas para que sea posible observar sus efectos en forma [place], es importante que sigue su tratamiento como recetado por su mdico. Los pacientes y sus familias deben estar atentos si empeora la depresin o ideas suicidas. Tambin est atento a cambios repentinos o severos de emocin, tales como el sentirse ansioso, agitado, lleno de pnico, irritable, hostil, agresivo, impulsivo, inquietud severa, demasiado excitado y hiperactivo o dificultad para conciliar el sueo. Si esto ocurre, especialmente al comenzar con el tratamiento o al cambiar de dosis, comunquese con su mdico. Puede experimentar somnolencia o [employer]. No conduzca ni utilice maquinaria, ni haga nada que Scientist, research (life sciences) en estado de alerta hasta que sepa cmo le afecta este medicamento. No se siente ni se ponga de pie con rapidez, especialmente si es un paciente de edad avanzada. Esto reduce el riesgo de mareos o [employer]. El alcohol puede interferir con el efecto de [place]. Evite consumir bebidas alcohlicas. Se le podr secar la boca. Masticar chicle sin azcar, chupar caramelos duros y tomar agua en abundancia le ayudar a mantener la boca hmeda. Si el problema no desaparece o es severo, consulte a su mdico. Este medicamento puede afectar sus niveles de Banker. Si tiene diabetes, consulte con su mdico o profesional de la salud antes de cambiar su dieta o la dosis de su medicamento para la diabetes. Qu efectos secundarios puedo tener al [employer] Scientific este medicamento? Efectos secundarios que debe informar a su mdico o a Producer, television/film/video de la salud tan pronto como sea posible: Therapist, art, como erupcin cutnea, comezn/picazn o urticarias, e hinchazn de la cara, los labios o la lengua ansiedad  heces de color negro y aspecto alquitranado problemas respiratorios cambios en la visin confusin estado de nimo elevado, menor necesidad de dormir, pensamientos acelerados, conducta impulsiva dolor ocular ritmo cardiaco rpido, irregular sensacin de desmayos o aturdimiento, cadas sensacin de agitacin, enojo o irritabilidad alucinaciones, prdida del contacto con la realidad prdida de equilibrio o coordinacin prdida de memoria inquietud, caminar de un lado a otro, incapacidad para quedarse quieto convulsiones rigidez de los Exelon Corporation ideas suicidas u otros cambios en el estado de nimo dificultad para conciliar el sueo sangrado o moretones inusuales cansancio o debilidad inusual vmito Efectos secundarios que generalmente no requieren atencin mdica (infrmelos a su mdico o a Producer, television/film/video de la salud si persisten o si son molestos): cambios en el apetito o el peso cambios en el deseo o desempeo sexual diarrea boca seca dolor de cabeza aumento de la sudoracin indigestin, nuseas temblores Puede ser que esta lista no menciona todos los posibles efectos secundarios. Comunquese a su mdico por asesoramiento mdico [employer]lett[employer]ard. Usted puede informar los efectos secundarios a la FDA por telfono al 1-800-FDA-1088. Dnde debo guardar mi medicina? Mantngala fuera del alcance de los nios. Gurdela a San[employer]I, entre 15 y 30 grados C (45 y 57 grados F). Deseche todo el medicamento que no haya utilizado, despus de la fecha de vencimiento. ATENCIN: Este folleto es un resumen. Puede ser que no cubra toda la posible informacin. Si usted tiene Hydrographic surveyor  de [employer], consulte con su mdico, su farmacutico o su profesional de KB Home	[place].  2018 Elsevier/Gold Standard (2016-07-11 00:00:00)

## 2016-08-23 NOTE — Addendum Note (Signed)
Addended by: Kem ParkinsonBARNES, Shields Pautz on: 08/23/2016 02:17 PM   Modules accepted: Orders

## 2016-08-23 NOTE — Addendum Note (Signed)
Addended by: Kem ParkinsonBARNES, Deonna Krummel on: 08/23/2016 02:55 PM   Modules accepted: Orders

## 2016-08-26 ENCOUNTER — Ambulatory Visit (INDEPENDENT_AMBULATORY_CARE_PROVIDER_SITE_OTHER): Payer: BLUE CROSS/BLUE SHIELD | Admitting: Gynecology

## 2016-08-26 ENCOUNTER — Encounter: Payer: Self-pay | Admitting: Gynecology

## 2016-08-26 ENCOUNTER — Other Ambulatory Visit: Payer: BLUE CROSS/BLUE SHIELD

## 2016-08-26 ENCOUNTER — Ambulatory Visit (INDEPENDENT_AMBULATORY_CARE_PROVIDER_SITE_OTHER): Payer: BLUE CROSS/BLUE SHIELD

## 2016-08-26 ENCOUNTER — Ambulatory Visit: Payer: BLUE CROSS/BLUE SHIELD | Admitting: Gynecology

## 2016-08-26 ENCOUNTER — Other Ambulatory Visit: Payer: Self-pay | Admitting: *Deleted

## 2016-08-26 ENCOUNTER — Other Ambulatory Visit: Payer: Self-pay | Admitting: Gynecology

## 2016-08-26 DIAGNOSIS — N939 Abnormal uterine and vaginal bleeding, unspecified: Secondary | ICD-10-CM

## 2016-08-26 DIAGNOSIS — N920 Excessive and frequent menstruation with regular cycle: Secondary | ICD-10-CM

## 2016-08-26 DIAGNOSIS — N923 Ovulation bleeding: Secondary | ICD-10-CM

## 2016-08-26 LAB — COMPREHENSIVE METABOLIC PANEL
ALBUMIN: 4.2 g/dL (ref 3.6–5.1)
ALT: 16 U/L (ref 6–29)
AST: 18 U/L (ref 10–30)
Alkaline Phosphatase: 73 U/L (ref 33–115)
BUN: 11 mg/dL (ref 7–25)
CO2: 23 mmol/L (ref 20–31)
Calcium: 9.2 mg/dL (ref 8.6–10.2)
Chloride: 105 mmol/L (ref 98–110)
Creat: 0.61 mg/dL (ref 0.50–1.10)
Glucose, Bld: 87 mg/dL (ref 65–99)
Potassium: 4 mmol/L (ref 3.5–5.3)
SODIUM: 138 mmol/L (ref 135–146)
TOTAL PROTEIN: 6.9 g/dL (ref 6.1–8.1)
Total Bilirubin: 0.4 mg/dL (ref 0.2–1.2)

## 2016-08-26 LAB — LIPID PANEL
CHOL/HDL RATIO: 3.3 ratio (ref <5.0)
Cholesterol: 182 mg/dL (ref <200)
HDL: 56 mg/dL (ref >50)
LDL CALC: 111 mg/dL — AB (ref <100)
Triglycerides: 77 mg/dL (ref <150)
VLDL: 15 mg/dL (ref <30)

## 2016-08-26 LAB — CBC WITH DIFFERENTIAL/PLATELET
BASOS ABS: 0 {cells}/uL (ref 0–200)
Basophils Relative: 0 %
EOS PCT: 3 %
Eosinophils Absolute: 315 cells/uL (ref 15–500)
HCT: 44 % (ref 35.0–45.0)
HEMOGLOBIN: 14.7 g/dL (ref 11.7–15.5)
LYMPHS ABS: 3570 {cells}/uL (ref 850–3900)
Lymphocytes Relative: 34 %
MCH: 30.5 pg (ref 27.0–33.0)
MCHC: 33.4 g/dL (ref 32.0–36.0)
MCV: 91.3 fL (ref 80.0–100.0)
MONO ABS: 525 {cells}/uL (ref 200–950)
MPV: 10 fL (ref 7.5–12.5)
Monocytes Relative: 5 %
Neutro Abs: 6090 cells/uL (ref 1500–7800)
Neutrophils Relative %: 58 %
Platelets: 356 10*3/uL (ref 140–400)
RBC: 4.82 MIL/uL (ref 3.80–5.10)
RDW: 13.4 % (ref 11.0–15.0)
WBC: 10.5 10*3/uL (ref 3.8–10.8)

## 2016-08-26 MED ORDER — MEDROXYPROGESTERONE ACETATE 10 MG PO TABS: ORAL_TABLET | ORAL | 2 refills | Status: AC

## 2016-08-26 NOTE — Progress Notes (Signed)
   Patient is a 36 year old that presented to the office for a sonohysterogram and endometrial biopsy as a result of her complaining of intermenstrual bleeding see previous note dated 08/25/2015. She's also been complaining of vaginal odor and a wet prep was done which demonstrated bacterial vaginosis for which she has been on Flagyl 500 mg twice a day for one-week course. Patient with previous history of tubal ligation. She's also fasting for her blood work today which was not able to be obtained at that last office visit for her annual exam. Her Pap smear was done at her annual exam visit last week results pending.  Ultrasound today: Uterus measured 8.5 x 5.4 x 3.3 cm with endometrial stripe of 5.1 mm. Right and left ovary were normal. There was no fluid in the cul-de-sac. After was the cervix was cleansed with Betadine solution and a sterile Pipelle was introduced into the uterine cavity. Uterus measured approximate 7 cm and some tissue was obtained which was submitted for histological evaluation. Prior to this a sterile catheter had been introduced into the uterine cavity and normal saline was instilled and no defects were noted.  Assessment plan: Patient with recent history of intermenstrual bleeding endometrial biopsy done today sonohysterogram negative. Patient will be placed on Provera 10 mg for 10 days of each month for 3 months to see we can synchronize her cycle and then monitor her symptoms afterwards. Her fasting blood work was drawn today consisting of comprehensive metabolic panel, fasting lipid profile, TSH, CBC, and urinalysis. Will notify when all the above results are available. Pap smear result pending.

## 2016-08-28 ENCOUNTER — Encounter: Payer: Self-pay | Admitting: Gynecology

## 2016-08-28 ENCOUNTER — Telehealth: Payer: Self-pay

## 2016-08-28 NOTE — Telephone Encounter (Signed)
Patient advised to stay with Provera as directed and return at end of Rx for endometrial biopsy. I offered to transfer to appointment desk to schedule but she declined stating she will call back closer to time with her schedule to make appt.

## 2016-08-28 NOTE — Progress Notes (Signed)
Patient informed. See telephone encounter.

## 2016-08-28 NOTE — Telephone Encounter (Signed)
I explained result and need for Megace for 10 days each mos x 3 mos.  Patient said when she was here for visit you prescribed Provera for 10 days every mos for 3 mos. She picked up Rx and started it yesterday.  Rec?

## 2016-08-28 NOTE — Telephone Encounter (Signed)
-----   Message from Ok EdwardsJuan H Fernandez, MD sent at 08/28/2016  8:56 AM EST ----- Please inform patient that her endometrial biopsy demonstrated simple hyperplasia with no atypia or malignancy. Tell her that this would explain her irregular bleeding. Call in prescription for Megace 40 mg twice a day for 10 days of the month 3 months and then return to the office for an endometrial biopsy to make sure that this has cleared up call in 30 tablets

## 2016-08-28 NOTE — Telephone Encounter (Signed)
She will not need to take the Megace. Have her take the Provera as we had prescribed instead.

## 2016-11-06 ENCOUNTER — Encounter: Payer: Self-pay | Admitting: Gynecology

## 2016-11-28 ENCOUNTER — Encounter: Payer: Self-pay | Admitting: Gynecology

## 2019-06-25 DIAGNOSIS — Z20828 Contact with and (suspected) exposure to other viral communicable diseases: Secondary | ICD-10-CM | POA: Diagnosis not present

## 2019-09-24 ENCOUNTER — Ambulatory Visit: Payer: Medicaid Other | Attending: Internal Medicine

## 2019-09-24 DIAGNOSIS — Z23 Encounter for immunization: Secondary | ICD-10-CM

## 2019-09-24 NOTE — Progress Notes (Signed)
   [facility]ov[facility] Vaccination [facility]  Name:  Jennifer Duarte    MRN: [id number] DOB: [birth year]  09/24/2019  Jennifer Duarte was observed post Covid-19 immunization for 15 minutes without incident. Jennifer Duarte was provided with Vaccine Information Sheet and instruction to access the V-Safe system.   Ms. Cockerill was instructed to call 911 with any severe reactions post vaccine: Marland Kitchen Difficulty breathing  . Swelling of face and throat  . A fast heartbeat  . A bad rash all over body  . Dizziness and weakness   Immunizations Administered    Name Date Dose VIS Date Route   Pfizer COVID-19 Vaccine 09/24/2019 10:39 AM 0.3 mL 06/04/2019 Intramuscular   Manufacturer: [employer], [employer]   Lot: [id number]   NDC: [address]-1

## 2019-10-18 ENCOUNTER — Ambulatory Visit: Payer: Medicaid Other | Attending: Internal Medicine

## 2019-10-18 DIAGNOSIS — Z23 Encounter for immunization: Secondary | ICD-10-CM

## 2020-03-04 DIAGNOSIS — Z1152 Encounter for screening for COVID-19: Secondary | ICD-10-CM | POA: Diagnosis not present

## 2020-07-11 DIAGNOSIS — R1013 Epigastric pain: Secondary | ICD-10-CM | POA: Diagnosis not present

## 2021-01-11 ENCOUNTER — Other Ambulatory Visit (HOSPITAL_COMMUNITY)
Admission: RE | Admit: 2021-01-11 | Discharge: 2021-01-11 | Disposition: A | Discharge status: Home / Self Care | Payer: BC Managed Care – PPO | Source: Ambulatory Visit | Attending: Family Medicine | Admitting: Family Medicine

## 2021-01-11 ENCOUNTER — Other Ambulatory Visit: Payer: Self-pay | Admitting: Family Medicine

## 2021-01-11 DIAGNOSIS — Z01411 Encounter for gynecological examination (general) (routine) with abnormal findings: Secondary | ICD-10-CM | POA: Diagnosis not present

## 2021-01-11 DIAGNOSIS — R1013 Epigastric pain: Secondary | ICD-10-CM | POA: Diagnosis not present

## 2021-01-11 DIAGNOSIS — Z13220 Encounter for screening for lipoid disorders: Secondary | ICD-10-CM | POA: Diagnosis not present

## 2021-01-11 DIAGNOSIS — Z0000 Encounter for general adult medical examination without abnormal findings: Secondary | ICD-10-CM | POA: Diagnosis not present

## 2021-01-15 ENCOUNTER — Other Ambulatory Visit (HOSPITAL_BASED_OUTPATIENT_CLINIC_OR_DEPARTMENT_OTHER): Payer: Self-pay | Admitting: Family Medicine

## 2021-01-15 DIAGNOSIS — R1013 Epigastric pain: Secondary | ICD-10-CM

## 2021-01-15 LAB — CYTOLOGY - PAP
Comment: NEGATIVE
Diagnosis: NEGATIVE
High risk HPV: NEGATIVE

## 2021-01-18 ENCOUNTER — Ambulatory Visit (HOSPITAL_BASED_OUTPATIENT_CLINIC_OR_DEPARTMENT_OTHER)
Admission: RE | Admit: 2021-01-18 | Discharge: 2021-01-18 | Disposition: A | Discharge status: Home / Self Care | Payer: BC Managed Care – PPO | Source: Ambulatory Visit | Attending: Family Medicine | Admitting: Family Medicine

## 2021-01-18 ENCOUNTER — Other Ambulatory Visit: Payer: Self-pay

## 2021-01-18 DIAGNOSIS — R1013 Epigastric pain: Secondary | ICD-10-CM | POA: Diagnosis not present

## 2021-01-18 DIAGNOSIS — K8020 Calculus of gallbladder without cholecystitis without obstruction: Secondary | ICD-10-CM | POA: Diagnosis not present

## 2021-01-29 ENCOUNTER — Other Ambulatory Visit: Payer: Self-pay | Admitting: Family Medicine

## 2021-01-29 DIAGNOSIS — R1013 Epigastric pain: Secondary | ICD-10-CM | POA: Diagnosis not present

## 2021-01-29 DIAGNOSIS — R160 Hepatomegaly, not elsewhere classified: Secondary | ICD-10-CM

## 2021-02-01 ENCOUNTER — Other Ambulatory Visit: Payer: Self-pay

## 2021-02-01 ENCOUNTER — Ambulatory Visit
Admission: RE | Admit: 2021-02-01 | Discharge: 2021-02-01 | Disposition: A | Discharge status: Home / Self Care | Payer: BC Managed Care – PPO | Source: Ambulatory Visit | Attending: Family Medicine | Admitting: Family Medicine

## 2021-02-01 DIAGNOSIS — K760 Fatty (change of) liver, not elsewhere classified: Secondary | ICD-10-CM | POA: Diagnosis not present

## 2021-02-01 DIAGNOSIS — K8020 Calculus of gallbladder without cholecystitis without obstruction: Secondary | ICD-10-CM | POA: Diagnosis not present

## 2021-02-01 DIAGNOSIS — R160 Hepatomegaly, not elsewhere classified: Secondary | ICD-10-CM

## 2021-02-01 MED ORDER — GADOBENATE DIMEGLUMINE 529 MG/ML IV SOLN
12.0000 mL | Freq: Once | INTRAVENOUS | Status: AC | PRN
  Administered 2021-02-01: 12 mL via INTRAVENOUS

## 2021-03-22 DIAGNOSIS — K760 Fatty (change of) liver, not elsewhere classified: Secondary | ICD-10-CM | POA: Diagnosis not present

## 2021-03-22 DIAGNOSIS — K8020 Calculus of gallbladder without cholecystitis without obstruction: Secondary | ICD-10-CM | POA: Diagnosis not present

## 2021-03-22 DIAGNOSIS — A048 Other specified bacterial intestinal infections: Secondary | ICD-10-CM | POA: Diagnosis not present

## 2021-03-22 DIAGNOSIS — R1013 Epigastric pain: Secondary | ICD-10-CM | POA: Diagnosis not present

## 2021-03-30 DIAGNOSIS — A048 Other specified bacterial intestinal infections: Secondary | ICD-10-CM | POA: Diagnosis not present

## 2021-03-30 DIAGNOSIS — R1013 Epigastric pain: Secondary | ICD-10-CM | POA: Diagnosis not present

## 2022-05-24 DIAGNOSIS — Z8742 Personal history of other diseases of the female genital tract: Secondary | ICD-10-CM | POA: Diagnosis not present

## 2022-05-24 DIAGNOSIS — Z8632 Personal history of gestational diabetes: Secondary | ICD-10-CM | POA: Diagnosis not present

## 2022-05-24 DIAGNOSIS — Z1389 Encounter for screening for other disorder: Secondary | ICD-10-CM | POA: Diagnosis not present

## 2022-05-24 DIAGNOSIS — K219 Gastro-esophageal reflux disease without esophagitis: Secondary | ICD-10-CM | POA: Diagnosis not present

## 2022-05-24 DIAGNOSIS — Z720 Tobacco use: Secondary | ICD-10-CM | POA: Diagnosis not present

## 2022-05-24 DIAGNOSIS — Z8759 Personal history of other complications of pregnancy, childbirth and the puerperium: Secondary | ICD-10-CM | POA: Diagnosis not present

## 2022-05-24 DIAGNOSIS — Z01419 Encounter for gynecological examination (general) (routine) without abnormal findings: Secondary | ICD-10-CM | POA: Diagnosis not present

## 2022-09-10 IMAGING — MR MR ABDOMEN WO/W CM
11 of 17 series · 28 of 48 positions shown · IV contrast (multihance)
Comparison: Ultrasound January 18, 2021

CLINICAL DATA: Further evaluation hepatic lesion seen on prior
ultrasound.

EXAM:
MRI ABDOMEN WITHOUT AND WITH CONTRAST
TECHNIQUE: Multiplanar multisequence MR imaging of the abdomen was performed
both before and after the administration of intravenous contrast.
CONTRAST:  12mL MULTIHANCE GADOBENATE DIMEGLUMINE 529 MG/ML IV SOLN

[Series 3: cor haste · coronal · 5.0mm · 0.68mm/px · 2 of 32 slices shown]
[im 1/32]
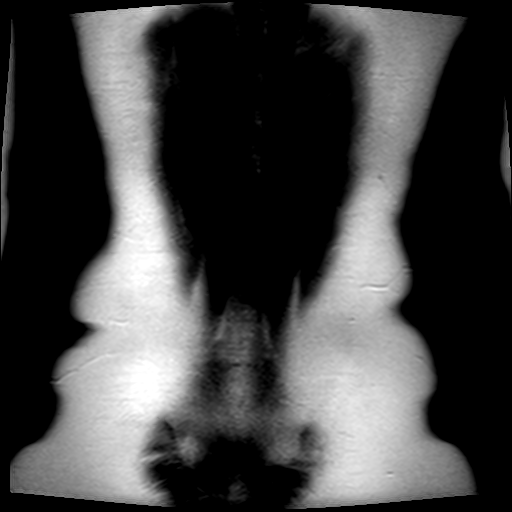
[im 32/32]
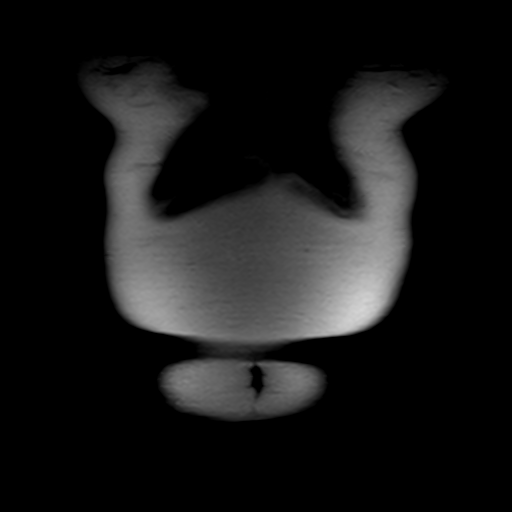

[Series 4: axial haste · axial · 6.0mm · 0.68mm/px · z∈[-85,+126]mm · 2 of 33 slices shown]
[im 1/33]
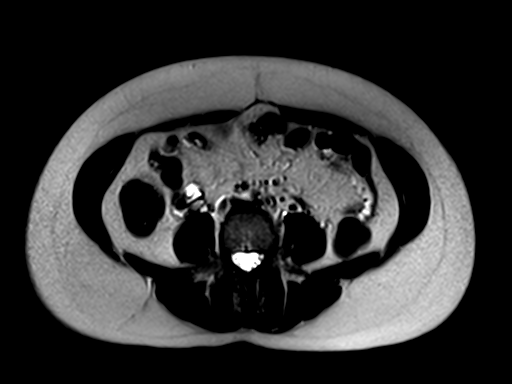
[im 33/33]
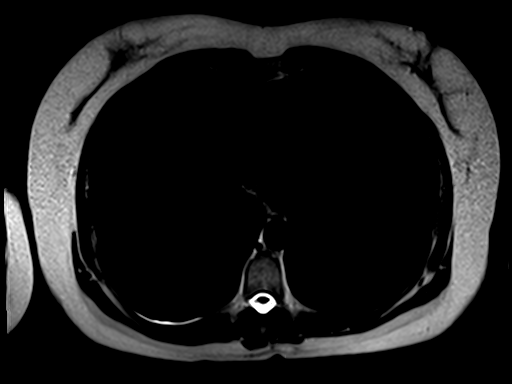

[Series 5: T1 · axial · 6.0mm · 0.68mm/px · z∈[-79,+132]mm · 4 of 66 slices shown]
[im 1/66]
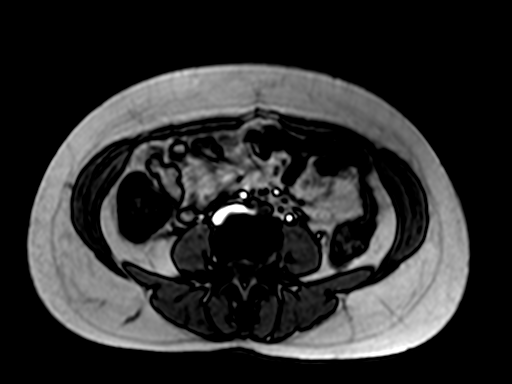
[im 22/66]
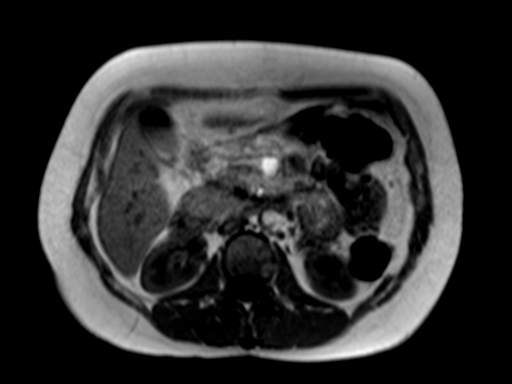
[im 44/66]
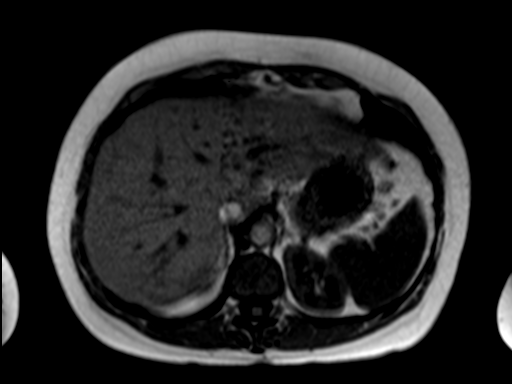
[im 66/66]
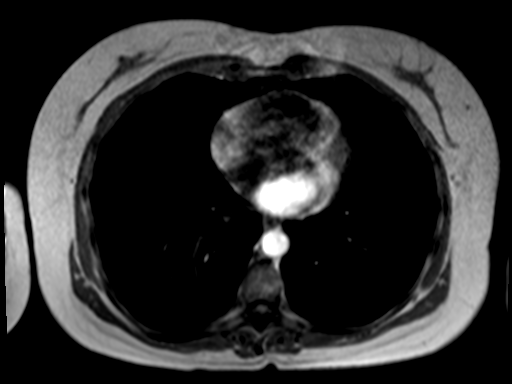

[Series 6: bSSFP · axial · 4.0mm · 0.68mm/px · z∈[-106,+134]mm · 3 of 61 slices shown]
[im 1/61]
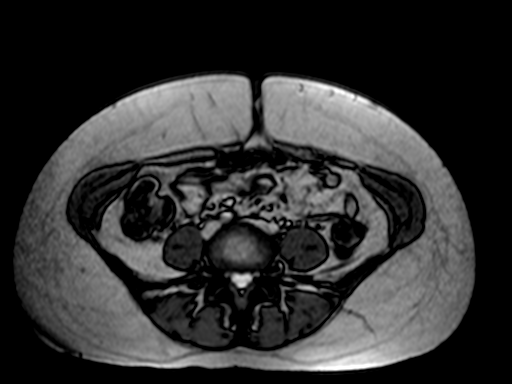
[im 31/61]
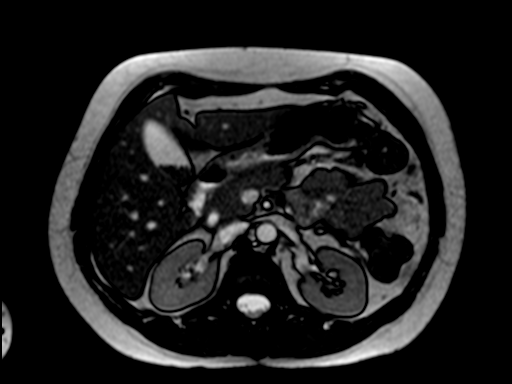
[im 61/61]
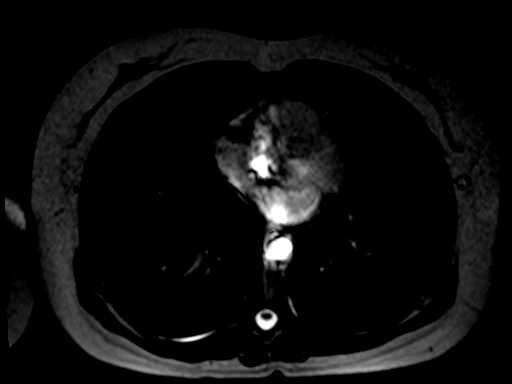

[Series 7: T2 fat-sat · axial · 6.0mm · 1.09mm/px · z∈[-70,+154]mm · 2 of 32 slices shown]
[im 1/32]
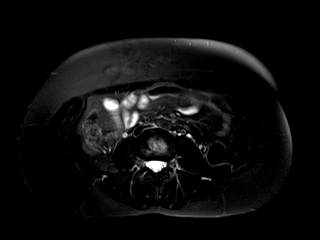
[im 32/32]
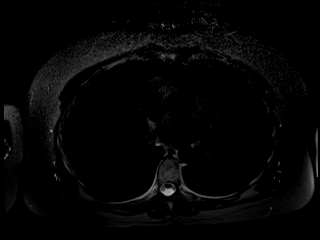

[Series 8: [id number]_trig · axial · 6.0mm · 1.82mm/px · z∈[-69,+155]mm · 4 of 96 slices shown]
[im 1/96]
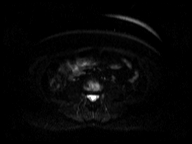
[im 32/96]
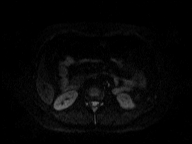
[im 64/96]
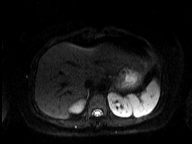
[im 96/96]
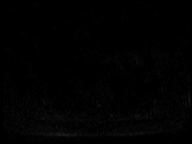

[Series 9: [id number]_trig_adc · axial · 6.0mm · 1.82mm/px · 1 of 32 slices shown]
[im 1/32]
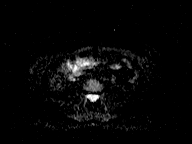

[Series 10: T1 dynamic · axial · non-contrast · 2.5mm · 0.74mm/px · z∈[-94,+123]mm · 3 of 88 slices shown]
[im 1/88]
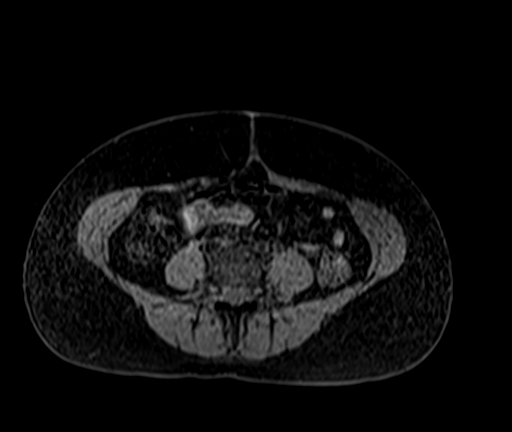
[im 44/88]
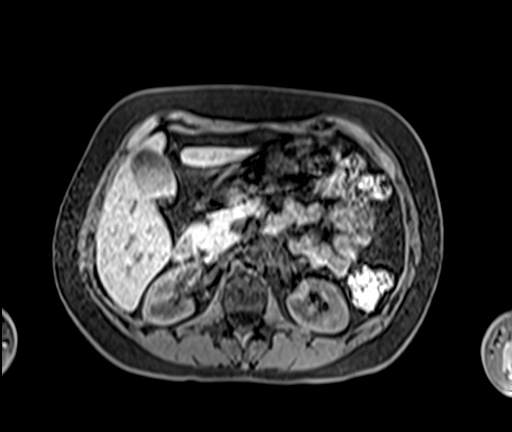
[im 88/88]
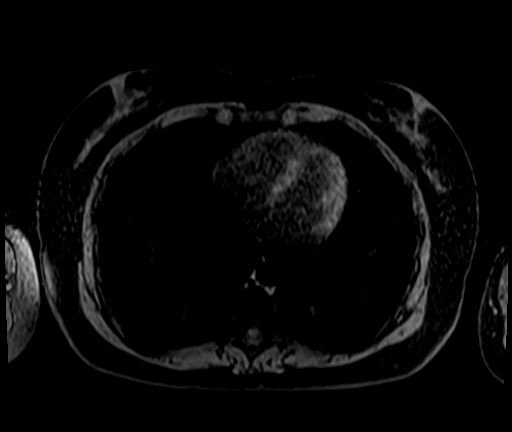

[Series 11: T1 dynamic post-contrast · axial · 2.5mm · 0.74mm/px · z∈[-94,+123]mm · 3 of 88 slices shown (1 of 3)]
[im 1/88]
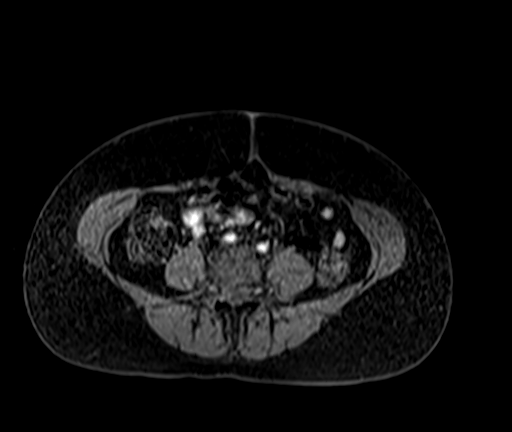
[im 44/88]
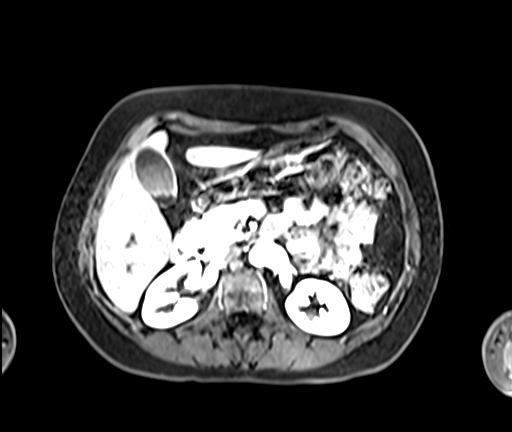
[im 88/88]
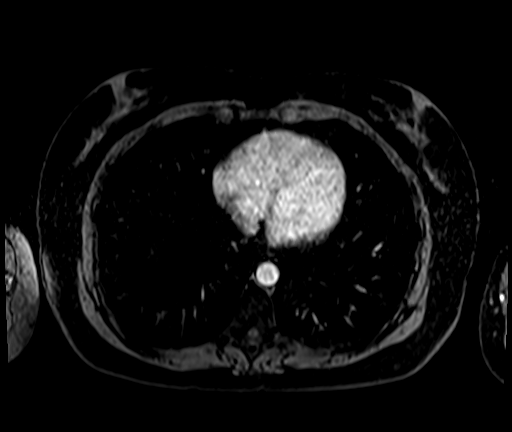

[Series 12: T1 dynamic post-contrast · axial · 2.5mm · 0.74mm/px · z∈[-94,+123]mm · 3 of 88 slices shown (2 of 3)]
[im 1/88]
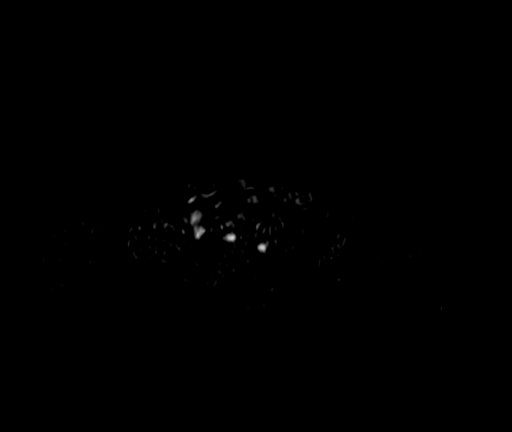
[im 44/88]
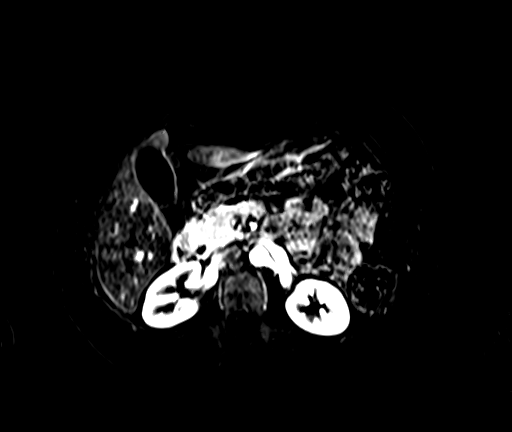
[im 88/88]
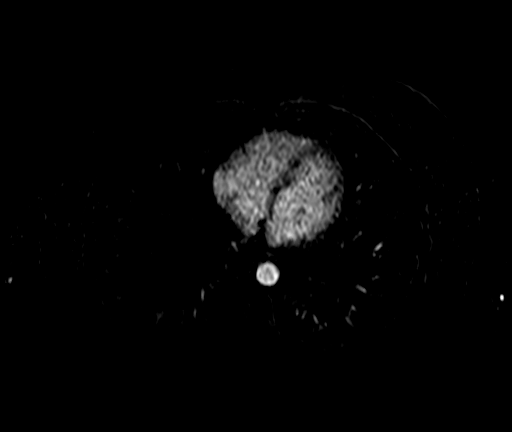

[Series 13: T1 dynamic post-contrast · axial · 2.5mm · 0.74mm/px · 1 of 88 slices shown (3 of 3)]
[im 1/88]
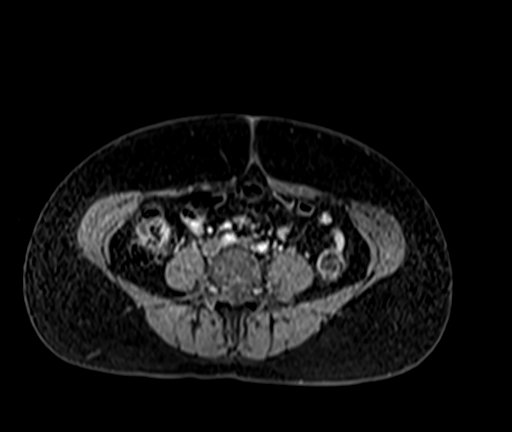

[28 of 48 positions shown; findings below may reference images not displayed]

FINDINGS: Lower chest: No acute abnormality.

Hepatobiliary: Very mild loss of signal throughout the hepatic
parenchyma on out of phase imaging, consistent hepatic steatosis. No
suspicious hepatic lesion. Cholelithiasis without findings of acute
cholecystitis. No biliary ductal dilation.

Pancreas: Intrinsic T1 signal pancreatic parenchyma within normal.
No pancreatic ductal dilation. No cystic or solid arterially
enhancing pancreatic lesion visualized.

Spleen:  Within normal limits.

Adrenals/Urinary Tract: Bilateral adrenal glands are unremarkable.
No hydronephrosis. No solid enhancing renal mass.

Stomach/Bowel: Visualized portions within the abdomen are
unremarkable.

Vascular/Lymphatic: No pathologically enlarged lymph nodes
identified. No abdominal aortic aneurysm demonstrated.

Other:  No abdominal ascites.

Musculoskeletal: No suspicious bone lesions identified.
IMPRESSION: 1. Very mild hepatic steatosis. No suspicious hepatic lesion
visualized. Specifically no lesion visualized within the left lobe
of the liver to correspond with the findings on prior ultrasound.
2. Cholelithiasis without findings of acute cholecystitis.
